# Patient Record
Sex: Male | Born: 1965 | Race: White | Hispanic: No | Marital: Married | Smoking: Never smoker
Health system: Southern US, Community
[De-identification: ages and names within clinical notes are randomized; demographics above are authoritative.]

## PROBLEM LIST (undated history)

## (undated) DIAGNOSIS — I499 Cardiac arrhythmia, unspecified: Secondary | ICD-10-CM

## (undated) DIAGNOSIS — R112 Nausea with vomiting, unspecified: Secondary | ICD-10-CM

## (undated) DIAGNOSIS — R002 Palpitations: Secondary | ICD-10-CM

## (undated) DIAGNOSIS — T4145XA Adverse effect of unspecified anesthetic, initial encounter: Secondary | ICD-10-CM

## (undated) DIAGNOSIS — R599 Enlarged lymph nodes, unspecified: Secondary | ICD-10-CM

## (undated) DIAGNOSIS — Z9889 Other specified postprocedural states: Secondary | ICD-10-CM

## (undated) DIAGNOSIS — T8859XA Other complications of anesthesia, initial encounter: Secondary | ICD-10-CM

## (undated) HISTORY — PX: WISDOM TOOTH EXTRACTION: SHX21

## (undated) HISTORY — PX: LYMPH GLAND EXCISION: SHX13

## (undated) HISTORY — DX: Palpitations: R00.2

## (undated) HISTORY — DX: Enlarged lymph nodes, unspecified: R59.9

## (undated) HISTORY — PX: KNEE ARTHROSCOPY W/ MENISCAL REPAIR: SHX1877

---
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
## 2004-11-24 ENCOUNTER — Ambulatory Visit: Payer: Self-pay | Admitting: Family Medicine

## 2006-01-07 ENCOUNTER — Ambulatory Visit: Payer: Self-pay | Admitting: Internal Medicine

## 2006-02-06 ENCOUNTER — Ambulatory Visit: Payer: Self-pay | Admitting: Internal Medicine

## 2007-08-04 ENCOUNTER — Ambulatory Visit: Payer: Self-pay | Admitting: Cardiology

## 2007-08-05 ENCOUNTER — Observation Stay (HOSPITAL_COMMUNITY): Admission: EM | Admit: 2007-08-05 | Discharge: 2007-08-05 | Payer: Self-pay | Admitting: Emergency Medicine

## 2007-08-05 ENCOUNTER — Ambulatory Visit: Payer: Self-pay | Admitting: Internal Medicine

## 2007-08-05 ENCOUNTER — Encounter (INDEPENDENT_AMBULATORY_CARE_PROVIDER_SITE_OTHER): Payer: Self-pay | Admitting: Internal Medicine

## 2007-08-11 ENCOUNTER — Ambulatory Visit: Payer: Self-pay | Admitting: Internal Medicine

## 2007-10-17 ENCOUNTER — Ambulatory Visit: Payer: Self-pay | Admitting: Internal Medicine

## 2007-12-19 ENCOUNTER — Ambulatory Visit: Payer: Self-pay | Admitting: Internal Medicine

## 2008-04-22 ENCOUNTER — Telehealth (INDEPENDENT_AMBULATORY_CARE_PROVIDER_SITE_OTHER): Payer: Self-pay | Admitting: *Deleted

## 2008-04-26 ENCOUNTER — Ambulatory Visit: Payer: Self-pay | Admitting: Internal Medicine

## 2008-04-26 DIAGNOSIS — R21 Rash and other nonspecific skin eruption: Secondary | ICD-10-CM | POA: Insufficient documentation

## 2010-01-21 ENCOUNTER — Ambulatory Visit: Payer: Self-pay | Admitting: Internal Medicine

## 2010-01-21 DIAGNOSIS — J111 Influenza due to unidentified influenza virus with other respiratory manifestations: Secondary | ICD-10-CM | POA: Insufficient documentation

## 2010-01-21 DIAGNOSIS — J019 Acute sinusitis, unspecified: Secondary | ICD-10-CM

## 2010-12-26 NOTE — Assessment & Plan Note (Signed)
Summary: fever,cong,?sinus infection/nta   Vital Signs:  Patient profile:   45 year old male Height:      77 inches Weight:      225 pounds BMI:     26.78 O2 Sat:      97 % on Room air Temp:     97.9 degrees F oral Pulse rate:   109 / minute BP sitting:   128 / 72  (right arm) Cuff size:   large  Vitals Entered By: Lamar Sprinkles, CMA (January 21, 2010 9:18 AM)  O2 Flow:  Room air CC: Pt c/o sinus pressure/pain, fever and productive cough w/yellow-green mucus. Symptoms started wednesday.    History of Present Illness: George [facility].   onset 3 days ago and now having front cheek pain area  and sore around eyes.   Better today than yesteday  . took  Dayquil and nyquil and advil.    fever  2 .5   days. Didnt get flu shot .  some cough but no cpsob. has myalgias .Marland Kitchen no Vor D.   family member with flu like illness.  No hx of recurrent sinusitis asthma  chronic disease  Preventive Screening-Counseling & Management  Alcohol-Tobacco     Smoking Status: never  Current Medications (verified): 1)  Bactroban 2 %  Crea (Mupirocin Calcium) .... Apply Two Times A Day If Cellulitis Present  Allergies (verified): No Known Drug Allergies  Past History:  Past Medical History: unremarkable  Social History: hh of 7    Non smoker Smoking Status:  never  Review of Systems       The patient complains of anorexia and fever.  The patient denies weight loss, weight gain, vision loss, decreased hearing, hoarseness, chest pain, syncope, dyspnea on exertion, peripheral edema, prolonged cough, hemoptysis, abdominal pain, abnormal bleeding, enlarged lymph nodes, and angioedema.    Physical Exam  General:  congested in nad non toxic apperaing  Head:  Normocephalic and atraumatic without obvious abnormalities. No apparent alopecia or balding. Eyes:  vision grossly intact, pupils equal, and pupils round.   Ears:  R ear normal, L ear normal, and no external  deformities.   Nose:  no external deformity and no external erythema.  congested   face non tender  Mouth:  pharynx pink and moist.   Neck:  No deformities, masses, or tenderness noted. Lungs:  Normal respiratory effort, chest expands symmetrically. Lungs are clear to auscultation, no crackles or wheezes.no dullness.   Heart:  Normal rate and regular rhythm. S1 and S2 normal without gallop, murmur, click, rub or other extra sounds.no lifts.   Pulses:  nl cap refill  Extremities:  no clubbing cyanosis or edema  Skin:  turgor normal, color normal, no ecchymoses, and no petechiae.   Cervical Nodes:  No lymphadenopathy noted Psych:  Oriented X3, good eye contact, not anxious appearing, and not depressed appearing.     Impression & Recommendations:  Problem # 1:  INFLUENZA LIKE ILLNESS (ICD-487.1) poss complicated by sinusitis  .  Expectant management for alarm features    Problem # 2:  SINUSITIS - ACUTE-NOS (ICD-461.9) improvement today      disc aboutuse of med if needed His updated medication list for this problem includes:    Amoxicillin 500 Mg Caps (Amoxicillin) .Marland Kitchen... 2 by mouth three times a day for sinusitis  Complete Medication List: 1)  Bactroban 2 % Crea (Mupirocin calcium) .... Apply two times a day if cellulitis  present 2)  Amoxicillin 500 Mg Caps (Amoxicillin) .... 2 by mouth three times a day for sinusitis  Patient Instructions: 1)  I think you have the flu  2)  fever should be gone  by the end of the weekend if persistent and progressive  call to be rechecked . 3)  If sinus pain returning then can add antibiotic for this . 4)  Acute sinusitis symptoms for less than 10 days are not helped by antibiotics. Use warm moist compresses, and over the counter decongestants( only as directed). Call if no improvement in 5-7 days, sooner if increasing pain, fever, or new symptoms.  Prescriptions: AMOXICILLIN 500 MG CAPS (AMOXICILLIN) 2 by mouth three times a day for sinusitis  #60 x  0   Entered and Authorized by:   Madelin Headings MD   Signed by:   Madelin Headings MD on 01/21/2010   Method used:   Print then Give to Patient   RxID:   [id number]

## 2011-04-10 NOTE — Discharge Summary (Signed)
George Lawrence, George Lawrence                  ACCOUNT NO.:  [id number]   MEDICAL RECORD NO.:  [id number]          PATIENT TYPE:  INP   LOCATION:  6523                         FACILITY:  [facility]   PHYSICIAN:  Valerie A. Felicity Coyer, MDDATE OF BIRTH:  30-Jan-1966   DATE OF ADMISSION:  08/04/2007  DATE OF DISCHARGE:  08/05/2007                               DISCHARGE SUMMARY   DISCHARGE DIAGNOSIS:  1. Symptomatic rapid paroxysmal A fib, spontaneous conversion to      normal sinus, see details below.  2. Seasonal allergies.   DISCHARGE MEDICATIONS:  Include diltiazem CD 120 mg once daily, aspirin  81 mg once daily,  Zyrtec 10 mg daily p.r.n. allergy symptoms.   HOSPITAL FOLLOW-UP:  1. Scheduled with [place] cardiology, Dr. Dietrich Pates for this Friday      September 12 at 9:45 a.m. to review results of echocardiogram done      here at [facility] as well as follow-up on pending TSH and      her need for other evaluation and treatment  2. With primary care physician Dr. Marga Melnick to call for      appointment in 2 weeks or as needed.   CONDITION ON DISCHARGE:  Medically stable and normal sinus rhythm,  asymptomatic.  No chest pain, no shortness of breath, no nausea,  vomiting, anxious for discharge home.   HOSPITAL COURSE BY PROBLEM.:  1. Paroxysmal rapid A fib.  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  He reports that earlier in that evening he      had been delivering a package when he felt palpitations associated      with lightheadedness.  He happened to be delivering to a      physician's house and due to these symptoms this  physician checked      his pulse, found it to be rapid and irregular and referred him for      evaluation in the emergency room. There he was found to be in rapid      A fib by report and begun on  diltiazem drip. As this was begun, he      spontaneously converted back to a normal sinus rhythm with      immediate relief of his symptoms. Point of care cardiac enzymes in      the emergency room were negative x2 but due to these symptoms he      was referred for observation and further evaluation.  The patient      during this time was very anxious for discharge home as he felt      well and had other outside obligations and TSH was checked and      still pending at time of this dictation.  CBC, basic metabolic were      unremarkable.  UDS was negative for drugs of abuse.  He had  no      caffeine, no use of cold or allergy medications or sinus      medications in the last 48 hours, no history of previous clots or      previous breathing problems.  No history of anxiety.  No history of      heart disease.  He was placed empirically on diltiazem CD 120 mg      daily which he tolerated hemodynamically well.  There was no      recurrence of A fib on the monitor and a 2-D echo was checked with      formal results pending at the time of this dictation.  Of note, I      have discussed the story with Dr. Jerral Bonito who is reading      echocardiograms today. He says the LV function is overall normal      but does question focal apical wall motion abnormality versus band      effect and recommend follow-up formally with a cardiologist, which      he feels as appropriate as an outpatient basis given the patient's      lack of symptoms and negative evaluation by report as such.  As      made I have made this follow-up for this week with Dr. Dietrich Pates      as described and the patient is aware of need for follow-up.  I      have chosen not to anticoagulate him at this time due to the      symptomatic short duration with spontaneous conversion, no  LV      function abnormality other than as noted with further discussion      regarding need for other      cardiac ischemic workup testing or  anticoagulation will be deferred      to Dr. Tenny Craw and [facility] cardiology team.  This has been reviewed      with the patient who understands and is aware of the need for      follow-up.  He is otherwise stable for discharge home this      afternoon.      Valerie A. Felicity Coyer, MD  Electronically Signed     VAL/MEDQ  D:  08/05/2007  T:  08/05/2007  Job:  [id number]

## 2011-04-10 NOTE — H&P (Signed)
George Lawrence, GREENLEY NO.:  [id number]   MEDICAL RECORD NO.:  [id number]          PATIENT TYPE:  EMS   LOCATION:  MAJO                         FACILITY:  [facility]   PHYSICIAN:  Hollice Espy, M.D.DATE OF BIRTH:  Apr 08, 1966   DATE OF ADMISSION:  08/04/2007  DATE OF DISCHARGE:                              HISTORY & PHYSICAL   PRIMARY CARE PHYSICIAN:  Titus Dubin. Alwyn Ren, MD,FACP,FCCP.   CHIEF COMPLAINT:  Palpitations.   HISTORY OF PRESENT ILLNESS:  The patient is a 45 year old white male  with past medical history of cat scratch fever when he was a child who  presents to the emergency room today with sudden onset of complaints of  palpitations and lightheadedness.  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  He was delivering a package to a physician who  examined him and found his pulse to be irregular and advised the patient  to go over to the emergency room.  The patient went to the emergency  room.  He had an EKG done and was found to be in atrial fibrillation.  He was started on Cardizem drip which converted him to normal sinus  rhythm.  Apparently he was on the drip at 5 mg an hour with heart rate  of 70, normal sinus rhythm.  He feels okay.   He denies any headaches, vision changes, dysphasia, chest pain,  palpitations, shortness of breath, wheeze, cough, abdominal pain,  hematuria, dysuria, constipation, diarrhea, focal extremity numbness,  weakness or pain.   Lab work was done including EKG, cardiac enzymes, all of which were  unremarkable.   PAST MEDICAL HISTORY:  Cat scratch fever, status post lymph node removal  as a child.   MEDICATIONS:  None.   ALLERGIES:  None.   SOCIAL HISTORY:  Denies any tobacco, alcohol or drug use.  No excessive  caffeine intake.  No energy drink or herbal stimulant intake.  He does  take an occasional multivitamins.   FAMILY  HISTORY:  Noncontributory.   PHYSICAL EXAMINATION:  VITAL SIGNS:  On admission, heart rate initially  151, now down to 80 and normal sinus rhythm.  Temperature 98.8, blood  pressure 141/84, respiratory rate 18.  His blood pressure since his  heart rate has come down is 108/74.  Oxygen saturation 98% on room air.  GENERAL:  The patient is alert and oriented x3, in no apparent distress.  HEENT:  Normocephalic, atraumatic.  Mucous membranes are moist.  He has  no carotid bruits.  HEART:  Regular rate and rhythm.  S1 and S2.  LUNGS:  Clear to auscultation bilaterally.  ABDOMEN:  Soft, nontender, nondistended.  Positive bowel sounds.  EXTREMITIES:  No clubbing, cyanosis or edema.   LABORATORY DATA:  White count 6.5, hemoglobin 15.6, hematocrit 45, MCV  91, platelet count 205,000.  Chest x-ray unremarkable.  LFTs  unremarkable.  CPK 53.7, MB less than 1, troponin-I less than 0.05.  Sodium 140,  potassium 4, chloride 107, bicarb 28,  BUN 15, creatinine 1, glucose 92.  EKG initially showed atrial fibrillation.  Second showed normal sinus  rhythm.  Urine drug screen negative.   ASSESSMENT/PLAN:  1. Brief onset of atrial fibrillation.  Unusual in a patient with no      previous medical conditions and not on any type of stimulants.      Will plan to watch the patient overnight on telemetry.  Check a 2-D      echocardiogram.  Have ordered a TSH level.  Will wean him off his      Cardizem drip and start him on p.o. Cardizem.      Hollice Espy, M.D.  Electronically Signed     SKK/MEDQ  D:  08/05/2007  T:  08/05/2007  Job:  [id number]   cc:   Titus Dubin. Alwyn Ren, MD,FACP,FCCP

## 2011-04-10 NOTE — Assessment & Plan Note (Signed)
George Lawrence                            CARDIOLOGY OFFICE NOTE   George Lawrence, George Lawrence                         MRN:          [id number]  DATE:12/19/2007                            DOB:          [birth year]    IDENTIFICATION:  The patient is a 45 year old gentleman with a history  of lone atrial fibrillation.  I last saw him back in November.   He comes in today as he said on Sunday he was watching TV, he had  finished splitting wood a couple hours prior, he was watching football,  and he began noticing his heart skip.  It would come on every 5 minutes  and he would notice about 8 or 10 skipped beats and then resolve.  He  took his pulse at his carotid and he said he felt not a rapid heartbeat  but more like a skin happened, and after a pause he feels a surge.   He denies caffeine.  He denies excessive alcohol.  Had not had any other  illness or symptoms of illness at that time.   CURRENT MEDICATIONS:  1. Diltiazem 120.  2. Aspirin 81.  3. Multivitamin.  4. Vitamin C.   PHYSICAL EXAMINATION:  GENERAL:  The patient is in no distress.  VITAL SIGNS:  Blood pressure is 133/85, pulse is 85 and regular, weight  221.  LUNGS:  Clear to auscultation.  CARDIAC:  Regular rate and rhythm, S1-S2.  No S3, no murmurs.  ABDOMEN:  Benign.  EXTREMITIES:  No edema.   STUDIES:  A 12-lead EKG normal sinus rhythm, sinus arrhythmia.   IMPRESSION:  Skips, does not actually sound like atrial fibrillation,  sounds like it may be isolated PVCs, and he is feeling the post PVCs  surge.  If this is the only spell, I would not work up further.  If he  has a recurrence, I will check electrolytes and thyroid.  1. Atrial fibrillation, again, continue on current regimen.  I have      encouraged him to stay active, stay adequately hydrated, watch      excess alcohol and caffeine.  He is drinking no caffeinated tea.  A      follow-up as previously scheduled in August.     Pricilla Riffle, MD, [facility]  Electronically Signed   PVR/MedQ  DD: 12/19/2007  DT: 12/19/2007  Job #: [id number]   cc:   Titus Dubin. Alwyn Ren, MD,FACP,FCCP

## 2011-04-13 NOTE — Assessment & Plan Note (Signed)
[facility]                            CARDIOLOGY OFFICE NOTE   George Lawrence, George Lawrence                         MRN:          [id number]  DATE:10/17/2007                            DOB:          [birth year]    Date of visit:  October 17, 2007.   IDENTIFICATION:  George Lawrence is a 45 year old gentleman who I was set to  see in September.  He could not wait though for the appointment.  He had  to reschedule for today.   The patient has a history of episode of atrial fibrillation.  He was  admitted in September.  He was discharged home on Diltiazem CD.   In the interval he has done well.  He denies palpitations.  He is  active.  No dizziness.   CURRENT MEDICATIONS:  1. Diltiazem 120 daily.  2. Aspirin 81.  3. Vitamin daily.  4. Vitamin C daily.   PHYSICAL EXAMINATION:  The patient is in no distress.  His blood pressure is 118/80, pulse is 88 and regular, weight 221.  LUNGS:  Clear.  NECK:  JVP is normal.  CARDIAC:  Regular rate and rhythm.  S1/S2, no S3, no murmurs.  ABDOMEN:  Benign.  EXTREMITIES:  No edema.   IMPRESSION:  1. Atrial fibrillation, lone atrial fibrillation, echo normal.  I am      reluctant to take him off the Diltiazem.  He has done well.  I will      set to see him back in August.  If he has any recurrence of course,      contact sooner.  2. Health care maintenance.  Will make sure he has a fasting lipid      panel at some point.     Pricilla Riffle, MD, [facility]  Electronically Signed    PVR/MedQ  DD: 10/19/2007  DT: 10/20/2007  Job #: [id number]   cc:   Titus Dubin. Alwyn Ren, MD,FACP,FCCP

## 2011-09-07 LAB — I-STAT 8, (EC8 V) (CONVERTED LAB)
BUN: 15
Bicarbonate: 27.6 — ABNORMAL HIGH
Glucose, Bld: 92
Hemoglobin: 16.3
Sodium: 140
TCO2: 29
pCO2, Ven: 51.3 — ABNORMAL HIGH
pH, Ven: 7.339 — ABNORMAL HIGH

## 2011-09-07 LAB — POCT I-STAT CREATININE: Creatinine, Ser: 1

## 2011-09-07 LAB — POCT CARDIAC MARKERS
CKMB, poc: <1 — ABNORMAL LOW
CKMB, poc: <1 — ABNORMAL LOW
Myoglobin, poc: 46.7
Myoglobin, poc: 53.7
Operator id: 151321
Operator id: 151321
Troponin i, poc: <0.05

## 2011-09-07 LAB — HEPATIC FUNCTION PANEL
AST: 26
Bilirubin, Direct: 0.1
Total Bilirubin: 0.9

## 2011-09-07 LAB — RAPID URINE DRUG SCREEN, HOSP PERFORMED
Barbiturates: NOT DETECTED
Benzodiazepines: NOT DETECTED

## 2011-09-07 LAB — CBC
Hemoglobin: 15.6
MCHC: 34.3
MCV: 91.3
RDW: 12.4

## 2011-09-07 LAB — DIFFERENTIAL
Basophils Absolute: 0
Basophils Relative: 0
Eosinophils Absolute: 0.2
Monocytes Absolute: 0.6
Monocytes Relative: 9
Neutro Abs: 4
Neutrophils Relative %: 61

## 2011-09-07 LAB — TSH: TSH: 1.682

## 2011-12-05 ENCOUNTER — Ambulatory Visit (INDEPENDENT_AMBULATORY_CARE_PROVIDER_SITE_OTHER): Payer: PRIVATE HEALTH INSURANCE | Admitting: Internal Medicine

## 2011-12-05 ENCOUNTER — Encounter: Payer: Self-pay | Admitting: Internal Medicine

## 2011-12-05 VITALS — BP 124/90 | HR 92 | Temp 98.1°F | Wt 221.2 lb

## 2011-12-05 DIAGNOSIS — J069 Acute upper respiratory infection, unspecified: Secondary | ICD-10-CM

## 2011-12-05 MED ORDER — AMOXICILLIN 500 MG PO CAPS: 500.0000 mg | ORAL_CAPSULE | Freq: Three times a day (TID) | ORAL | Status: AC

## 2011-12-05 MED ORDER — FLUTICASONE PROPIONATE 50 MCG/ACT NA SUSP: 1.0000 | Freq: Two times a day (BID) | NASAL | Status: DC | PRN

## 2011-12-05 NOTE — Progress Notes (Signed)
  Subjective:    Patient ID: George Lawrence, male    DOB: [birth year], 46 y.o.   MRN: [id number]  HPI Respiratory tract infection Onset/symptoms:1/4 as head congestion Exposures (illness/environmental/extrinsic):on his route Progression of symptoms:into chest  Treatments/response:Decongestants& Nyquil with temp benefit Present symptoms: Fever/chills/sweats:temp to ?; sweats last night Frontal headache:yes Facial pain:yes Nasal purulence:scant yellow Sore throat:no Dental pain:no Lymphadenopathy:no Wheezing/shortness of breath:no Cough/sputum/hemoptysis:NP cough Pleuritic pain:no Associated extrinsic/allergic symptoms:itchy eyes/ sneezing:no Past medical history: Seasonal allergies: yes/asthma:no Smoking history:never  He did take a flu shot this year.          Review of Systems     Objective:   Physical Exam General appearance:good health ;well nourished; no acute distress or increased work of breathing is present.  No  lymphadenopathy about the head, neck, or axilla noted.   Eyes: No conjunctival inflammation or lid edema is present.   Ears:  External ear exam shows no significant lesions or deformities.  Otoscopic examination reveals clear canals, tympanic membranes are intact bilaterally without bulging, retraction, inflammation or discharge.  Nose:  External nasal examination shows no deformity or inflammation. Nasal mucosa are pink and moist without lesions or exudates. Insignificant septal dislocation .No obstruction to airflow.   Oral exam: Dental hygiene is good; lips and gums are healthy appearing.There is no oropharyngeal erythema or exudate noted.      Heart:  Normal rate and regular rhythm. S1 and S2 normal without gallop,  click, rub . Faint flow murmur at the left sternal border suggested with inspiration only. Lungs:Chest clear to auscultation; no wheezes, rhonchi,rales ,or rubs present.No increased work of breathing.    Extremities:  No cyanosis, edema,  or clubbing  noted    Skin: Warm & dry           Assessment & Plan:  #1 upper respiratory tract infection ; rhinosinusitis suggested.  #2 nonproductive cough.  Plan see orders and recommendations

## 2011-12-05 NOTE — Patient Instructions (Signed)
Plain Mucinex for thick secretions ;force NON dairy fluids . Use a Neti pot daily as needed for sinus congestion . Fluticasone 1 spray in each nostril twice a day as needed. Use the "crossover" technique as discussed   

## 2015-09-27 ENCOUNTER — Ambulatory Visit: Payer: PRIVATE HEALTH INSURANCE | Admitting: Internal Medicine

## 2015-09-27 DIAGNOSIS — R4689 Other symptoms and signs involving appearance and behavior: Secondary | ICD-10-CM | POA: Insufficient documentation

## 2015-09-27 DIAGNOSIS — Z0289 Encounter for other administrative examinations: Secondary | ICD-10-CM

## 2015-10-12 ENCOUNTER — Other Ambulatory Visit: Payer: Self-pay

## 2015-10-12 DIAGNOSIS — R599 Enlarged lymph nodes, unspecified: Secondary | ICD-10-CM | POA: Insufficient documentation

## 2015-10-12 DIAGNOSIS — R002 Palpitations: Secondary | ICD-10-CM | POA: Insufficient documentation

## 2015-10-13 ENCOUNTER — Ambulatory Visit (INDEPENDENT_AMBULATORY_CARE_PROVIDER_SITE_OTHER): Payer: BLUE CROSS/BLUE SHIELD | Admitting: Internal Medicine

## 2015-10-13 ENCOUNTER — Encounter: Payer: Self-pay | Admitting: Internal Medicine

## 2015-10-13 VITALS — BP 129/80 | HR 94 | Ht 77.0 in | Wt 225.0 lb

## 2015-10-13 DIAGNOSIS — R002 Palpitations: Secondary | ICD-10-CM | POA: Diagnosis not present

## 2015-10-13 LAB — TSH: TSH: 0.994 u[IU]/mL (ref 0.350–4.500)

## 2015-10-13 NOTE — Progress Notes (Signed)
Cardiology Office Note   Date:  10/13/2015   ID:  George Lawrence, DOB 24-Oct-1966, MRN [id number]  PCP:  Marga Melnick, MD  Cardiologist:   Dietrich Pates, MD    F/U of Afib     History of Present Illness: George Lawrence is a 49 y.o. male with a history of atrial fib  Only had one spell in 2008  Hosp at that time  EKG in epic.  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  Sat in truck [place] go away.  No dizziness  Drove to next stop and called 911  When EMS arrive HR was coming down Does drink caffeinated drinks.     No palpitations in between   Active  No SOB  No CP  Nor dizziness Very active     Current Outpatient Prescriptions  Medication Sig Dispense Refill  . Diphenhydramine-Acetaminophen (TYLENOL SEVERE ALLERGY PO) Take 2 tablets by mouth as needed (ALLERGIES).    . DM-Doxylamine-Acetaminophen (NYQUIL COLD & FLU PO) Take 30 mLs by mouth Nightly.     No current facility-administered medications for this visit.    Allergies:   Oxycodone   Past Medical History  Diagnosis Date  . Heart palpitations   . Swollen lymph nodes     AGE 56 HAD CAT SCARTCH FEVER    Past Surgical History  Procedure Laterality Date  . Lymph gland excision    . Wisdom tooth extraction    . Knee arthroscopy w/ meniscal repair Bilateral     MENISCUS TEAR     Social History:  The patient  reports that he has never smoked. He does not have any smokeless tobacco history on file. He reports that he drinks alcohol. He reports that he does not use illicit drugs.   Family History:  The patient's family history includes Cancer (age of onset: 38) in his mother; Heart attack in his maternal uncle; Heart block in his father. There is no history of Hypertension.    ROS:  Please see the history of present illness. All other systems are reviewed and  Negative to the above problem except as  noted.    PHYSICAL EXAM: VS:  BP 129/80 mmHg  Pulse 94  Ht  (1.956 m)  Wt 102.059 kg (225 lb)  BMI 26.68 kg/m2  GEN: Well nourished, well developed, in no acute distress HEENT: normal Neck: no JVD, carotid bruits, or masses Cardiac: RRR; no murmurs, rubs, or gallops,no edema  Respiratory:  clear to auscultation bilaterally, normal work of breathing GI: soft, nontender, nondistended, + BS  No hepatomegaly  MS: no deformity Moving all extremities   Skin: warm and dry, no rash Neuro:  Strength and sensation are intact Psych: euthymic mood, full affect   EKG:  EKG is ordered today.  SR 94 bpm     Lipid Panel No results found for: CHOL, TRIG, HDL, CHOLHDL, VLDL, LDLCALC, LDLDIRECT    Wt Readings from Last 3 Encounters:  10/13/15 102.059 kg (225 lb)  12/05/11 100.336 kg (221 lb 3.2 oz)  01/21/10 102.059 kg (225 lb)      ASSESSMENT AND PLAN:  1  PAF  Spell a couple weeks ago suspicious for afib  Converted   Will follow for recurrences  If ncrease would set up for event monitor   Watch caffeine and etoh intake  F/u as needed       Signed, Dietrich PatesPaula Tashawna Thom, MD  10/13/2015 12:52 PM    [facility] [address], [place], [place]  [address] Phone: [phone]) [phone]; Fax: [phone]) [phone]

## 2015-10-13 NOTE — Patient Instructions (Signed)
Your physician recommends that you return for lab work in: today [facility](TSH)  Your physician recommends that you schedule a follow-up appointment as needed with Dr. Tenny Crawoss.

## 2019-01-30 ENCOUNTER — Ambulatory Visit (INDEPENDENT_AMBULATORY_CARE_PROVIDER_SITE_OTHER): Payer: Self-pay

## 2019-01-30 ENCOUNTER — Other Ambulatory Visit: Payer: Self-pay | Admitting: Gerontology

## 2019-01-30 DIAGNOSIS — M546 Pain in thoracic spine: Secondary | ICD-10-CM

## 2019-01-30 DIAGNOSIS — R52 Pain, unspecified: Secondary | ICD-10-CM

## 2019-01-30 DIAGNOSIS — M50323 Other cervical disc degeneration at C6-C7 level: Secondary | ICD-10-CM

## 2019-05-22 ENCOUNTER — Ambulatory Visit: Payer: Self-pay | Admitting: Orthopedic Surgery

## 2019-05-26 ENCOUNTER — Ambulatory Visit: Payer: Self-pay | Admitting: Orthopedic Surgery

## 2019-05-26 NOTE — H&P (Signed)
Subjective:   This patient is a pleasant 53 year old male with no significant past medical history who is scheduled for an ACDF of C6-7 at [facility] current hospital with Dr. 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 This has been ongoing since February 20, 2019 and despite conservative treatment measures to include medrol dose pack, physical therapy, and injections the patient continues to have symptoms that are interfering with his quality of life; he therefore would like to move forward with surgical intervention.  We have obtained pre-operative clearance (no formal recommendations from PCP). He is scheduled for his pre-op exam and testing at [facility]. He is also scheduled to be fitted for his aspen collar.  Patient Active Problem List   Diagnosis Date Noted  . Heart palpitations 10/12/2015  . Swollen lymph nodes   . Non-compliant behavior 09/27/2015   Past Medical History:  Diagnosis Date  . Heart palpitations   . Swollen lymph nodes    AGE 44 HAD CAT SCARTCH FEVER    Past Surgical History:  Procedure Laterality Date  . KNEE ARTHROSCOPY W/ MENISCAL REPAIR Bilateral    MENISCUS TEAR  . LYMPH GLAND EXCISION    . WISDOM TOOTH EXTRACTION      Current Outpatient Medications  Medication Sig Dispense Refill Last Dose  . acetaminophen (TYLENOL) 500 MG tablet Take 1,000 mg by mouth every 6 (six) hours as needed for moderate pain or headache.     . fexofenadine (ALLEGRA) 180 MG tablet Take 180 mg by mouth daily as needed for allergies or rhinitis.     Marland Kitchen gabapentin (NEURONTIN) 300 MG capsule Take 300 mg by mouth at bedtime.     Marland Kitchen ibuprofen (ADVIL) 200 MG tablet Take 800 mg by mouth every 6 (six) hours as needed for headache or moderate pain.     . Multiple Vitamins-Minerals (ONE-A-DAY MENS HEALTH FORMULA PO) Take 1 tablet by mouth daily.      No current facility-administered medications for this  visit.    Allergies  Allergen Reactions  . Oxycodone Hives    Social History   Tobacco Use  . Smoking status: Never Smoker  Substance Use Topics  . Alcohol use: Yes    Family History  Problem Relation Age of Onset  . Cancer Mother 70       PANCREATIC CANCER  . Heart block Father        STENTS X2  . Heart attack Maternal Uncle   . Hypertension Neg Hx     Review of Systems As stated in HPI  Objective:   General: AAOX3, well developed and well nourished, NAD  Ambulation: normal gait pattern, uses no assistive device.  Inspection: No obvious deformity, scoleosis, kyphosis, loss of lordotic curve.   Lungs: CTAB  Abdome: Normal BSX4, non-tender, non-distended, no hepatosplenomegaly.  Palpation: Tender over spinous processes and neck musculature.  AROM: -Shoulder, elbow, and wrists AROM normal and pain free.  Dermatomes: UE dermatomes abnormal to light touch left C7 dermatome pattern  Myotomes: - shoulder shrug: Left 5/5, Right 5/5 -Shoulder Abduction: Left 5/5, Right 5/5 - Elbow flexion: Left 5/5, Right 5/5 - Elbow extension Left 4+/5, Right 5/5 - Wrist extesion Left 4+/5, Right 4+/5 - Finger abduction: Left 5/5, Right 5/5 - finger Adduction/squeeze: Left 5/5, Right 5/5  Reflexes: - Biceps: Left1+, Right 1+ - Brachioradialius: Left1+, Right 1+ - Triceps: Left 1+, Right 1+ - Hoffman's: Negative - Babinski: Left Ngative, Right  Negative - Clonus: Negative    Special Tests: - UE Neural tension test: Left Positive, Right Negative  PV: Extremities warm and well profused. Distal pulses 2+ bilaterally.  Patient reports no significant improvement overall with his cervical epidural steroid injection.  X-Ray impression: cervical spine x-rays from 02/20/2019 show Severe DDD at C6/7 level. No significant listhesis, no significant curveture, no signs of fracture.  MRI Impression: Cervical MRI: completed on 04/09/19 was reviewed with the patient. I have also reviewed  the radiology report. 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 No cord signal changes. Mild to moderate degenerative changes at the remaining levels of the cervical spine.  Assessment:   Francis DowseJoel is a very pleasant 53 year old gentleman who has been having ongoing significant neck and neuropathic arm pain left side worse than the right. MRI confirms foraminal stenosis with C7 nerve root irritation. At this point despite the injection therapy, as well as physical therapy he continues to have neuropathic left arm pain and significant neck pain. At this point I think it is reasonable to move forward with surgery.  Plan:   [facility] C6-[facility] Shon BatonBrooks.  We have discussed the disc replacement as well as fusion and he would like to move forward with a straightforward single level ACDF. We have gone over the surgery Risks and benefits of surgery were discussed with the patient. These include: Infection, bleeding, death, stroke, paralysis, ongoing or worse pain, need for additional surgery, nonunion, leak of spinal fluid, adjacent segment degeneration requiring additional fusion surgery. Pseudoarthrosis (nonunion)requiring supplemental posterior fixation. Throat pain, swallowing difficulties, hoarseness or change in voice.  We have also discussed the goals of surgery to include: Goals of surgery: Reduction in pain, and improvement in quality of life.  We have also discussed the post-operative recovery period to include: bathing/showering restrictions, wound healing, activity (and driving) restrictions, medications/pain mangement. Of note, patient has a reported allergy to Hydrocodone.  We have also discussed post-operative redflags to include: signs and symptoms of postoperative infection, DVT/PE.  Follow-up: 2 weeks after surgery.

## 2019-05-28 ENCOUNTER — Encounter (HOSPITAL_COMMUNITY): Payer: Self-pay

## 2019-05-28 NOTE — Pre-Procedure Instructions (Signed)
[id number] - [place], [place] - [address] [place]  [address] Phone: [phone] Fax: [phone]      Your procedure is scheduled on 06-04-19 Thursday  Report to [facility] Main Entrance "A" at  0530 A.M., and check in at the Admitting office.  Call this number if you have problems the morning of surgery:  [phone]  Call [phone] if you have any questions prior to your surgery date Monday-Friday 8am-4pm    Remember:  Do not eat or drink after midnight the night before your surgery  Take these medicines the morning of surgery with A SIP OF WATER : acetaminophen (TYLENOL)as needed fexofenadine (ALLEGRA)as needed  7 days prior to surgery STOP taking any Aspirin (unless otherwise instructed by your surgeon), Aleve, Naproxen, Ibuprofen, Motrin, Advil, Goody's, BC's, all herbal medications, fish oil, and all vitamins.    The Morning of Surgery  Do not wear jewelry, make-up or nail polish.  Do not wear lotions, powders, or perfumes/colognes, or deodorant  Do not shave 48 hours prior to surgery.  Men may shave face and neck.  Do not bring valuables to the hospital.  [facility] is not responsible for any belongings or valuables.  If you are a smoker, DO NOT Smoke 24 hours prior to surgery IF you wear a CPAP at night please bring your mask, tubing, and machine the morning of surgery   Remember that you must have someone to transport you home after your surgery, and remain with you for 24 hours if you are discharged the same day.   Contacts, glasses, hearing aids, dentures or bridgework may not be worn into surgery.    Leave your suitcase in the car.  After surgery it may be brought to your room.  For patients admitted to the hospital, discharge time will be determined by your treatment team.  Patients discharged the day of surgery will not be allowed to drive home.    Special instructions:    [facility]- Preparing For Surgery  Before surgery, you can play an important role. Because skin is not sterile, your skin needs to be as free of germs as possible. You can reduce the number of germs on your skin by washing with CHG (chlorahexidine gluconate) Soap before surgery.  CHG is an antiseptic cleaner which kills germs and bonds with the skin to continue killing germs even after washing.    Oral Hygiene is also important to reduce your risk of infection.  Remember - BRUSH YOUR TEETH THE MORNING OF SURGERY WITH YOUR REGULAR TOOTHPASTE  Please do not use if you have an allergy to CHG or antibacterial soaps. If your skin becomes reddened/irritated stop using the CHG.  Do not shave (including legs and underarms) for at least 48 hours prior to first CHG shower. It is OK to shave your face.  Please follow these instructions carefully.   1. Shower the NIGHT BEFORE SURGERY and the MORNING OF SURGERY with CHG Soap.   2. If you chose to wash your hair, wash your hair first as usual with your normal shampoo.  3. After you shampoo, rinse your hair and body thoroughly to remove the shampoo.  4. Use CHG as you would any other liquid soap. You can apply CHG directly to the skin and wash gently with a scrungie or a clean washcloth.   5. Apply the CHG Soap to your body ONLY FROM THE NECK DOWN.  Do not use on open wounds or open sores. Avoid contact with your eyes, ears, mouth and genitals (private parts). Wash Face and genitals (private parts)  with your normal soap.   6. Wash thoroughly, paying special attention to the area where your surgery will be performed.  7. Thoroughly rinse your body with warm water from the neck down.  8. DO NOT shower/wash with your normal soap after using and rinsing off the CHG Soap.  9. Pat yourself dry with a CLEAN TOWEL.  10. Wear CLEAN PAJAMAS to bed the night before surgery, wear comfortable clothes the morning of surgery  11. Place CLEAN SHEETS on your bed  the night of your first shower and DO NOT SLEEP WITH PETS.    Day of Surgery:  Do not apply any deodorants/lotions. Please shower the morning of surgery with the CHG soap  Please wear clean clothes to the hospital/surgery center.   Remember to brush your teeth WITH YOUR REGULAR TOOTHPASTE.   Please read over the following fact sheets that you were given.

## 2019-06-01 ENCOUNTER — Encounter (HOSPITAL_COMMUNITY)
Admission: RE | Admit: 2019-06-01 | Discharge: 2019-06-01 | Disposition: A | Discharge status: Home / Self Care | Payer: No Typology Code available for payment source | Source: Ambulatory Visit | Attending: Orthopedic Surgery | Admitting: Orthopedic Surgery

## 2019-06-01 ENCOUNTER — Other Ambulatory Visit (HOSPITAL_COMMUNITY)
Admission: RE | Admit: 2019-06-01 | Discharge: 2019-06-01 | Disposition: A | Discharge status: Home / Self Care | Payer: No Typology Code available for payment source | Source: Ambulatory Visit | Attending: Orthopedic Surgery | Admitting: Orthopedic Surgery

## 2019-06-01 ENCOUNTER — Encounter (HOSPITAL_COMMUNITY): Payer: Self-pay

## 2019-06-01 ENCOUNTER — Other Ambulatory Visit: Payer: Self-pay

## 2019-06-01 ENCOUNTER — Ambulatory Visit (HOSPITAL_COMMUNITY)
Admission: RE | Admit: 2019-06-01 | Discharge: 2019-06-01 | Disposition: A | Discharge status: Home / Self Care | Payer: No Typology Code available for payment source | Source: Ambulatory Visit | Attending: Orthopedic Surgery | Admitting: Orthopedic Surgery

## 2019-06-01 DIAGNOSIS — Z1159 Encounter for screening for other viral diseases: Secondary | ICD-10-CM | POA: Insufficient documentation

## 2019-06-01 DIAGNOSIS — M47812 Spondylosis without myelopathy or radiculopathy, cervical region: Secondary | ICD-10-CM | POA: Diagnosis not present

## 2019-06-01 DIAGNOSIS — Z01818 Encounter for other preprocedural examination: Secondary | ICD-10-CM

## 2019-06-01 HISTORY — DX: Nausea with vomiting, unspecified: R11.2

## 2019-06-01 HISTORY — DX: Other specified postprocedural states: Z98.890

## 2019-06-01 HISTORY — DX: Other complications of anesthesia, initial encounter: T88.59XA

## 2019-06-01 HISTORY — DX: Cardiac arrhythmia, unspecified: I49.9

## 2019-06-01 LAB — BASIC METABOLIC PANEL
Anion gap: 10 (ref 5–15)
BUN: 21 mg/dL — ABNORMAL HIGH (ref 6–20)
CO2: 25 mmol/L (ref 22–32)
Calcium: 9.3 mg/dL (ref 8.9–10.3)
Chloride: 105 mmol/L (ref 98–111)
Creatinine, Ser: 1.16 mg/dL (ref 0.61–1.24)
GFR calc Af Amer: >60 mL/min (ref >60)
GFR calc non Af Amer: >60 mL/min (ref >60)
Glucose, Bld: 96 mg/dL (ref 70–99)
Potassium: 3.9 mmol/L (ref 3.5–5.1)
Sodium: 140 mmol/L (ref 135–145)

## 2019-06-01 LAB — CBC
HCT: 50.7 % (ref 39.0–52.0)
Hemoglobin: 16.8 g/dL (ref 13.0–17.0)
MCH: 31.2 pg (ref 26.0–34.0)
MCHC: 33.1 g/dL (ref 30.0–36.0)
MCV: 94.1 fL (ref 80.0–100.0)
Platelets: 195 10*3/uL (ref 150–400)
RBC: 5.39 MIL/uL (ref 4.22–5.81)
RDW: 11.8 % (ref 11.5–15.5)
WBC: 5.5 10*3/uL (ref 4.0–10.5)
nRBC: 0 % (ref 0.0–0.2)

## 2019-06-01 LAB — SURGICAL PCR SCREEN
MRSA, PCR: NEGATIVE
Staphylococcus aureus: POSITIVE — AB

## 2019-06-01 LAB — URINALYSIS, ROUTINE W REFLEX MICROSCOPIC
Bilirubin Urine: NEGATIVE
Glucose, UA: NEGATIVE mg/dL
Hgb urine dipstick: NEGATIVE
Ketones, ur: NEGATIVE mg/dL
Leukocytes,Ua: NEGATIVE
Nitrite: NEGATIVE
Protein, ur: NEGATIVE mg/dL
Specific Gravity, Urine: 1.021 (ref 1.005–1.030)
pH: 6 (ref 5.0–8.0)

## 2019-06-01 LAB — PROTIME-INR
INR: 1 (ref 0.8–1.2)
Prothrombin Time: 13.2 seconds (ref 11.4–15.2)

## 2019-06-01 LAB — APTT: aPTT: 31 seconds (ref 24–36)

## 2019-06-01 NOTE — Progress Notes (Signed)
PCP - Dr. Seward Carol Cardiologist - denies  Chest x-ray - 06/01/2019 EKG - requested, per patient "recently had one done in June" Stress Test - denies ECHO - 2008 Cardiac Cath - denies  Sleep Study - denies CPAP - N/A  Blood Thinner Instructions: N/A Aspirin Instructions: N/A  Anesthesia review: YES, per order and heart hx  Coronavirus Screening  Have you experienced the following symptoms:  Cough yes/no: No Fever (>100.31F)  yes/no: No Runny nose yes/no: No Sore throat yes/no: No Difficulty breathing/shortness of breath  yes/no: No  Have you or a family member traveled in the last 14 days and where? yes/no: No  If the patient indicates "YES" to the above questions, their PAT will be rescheduled to limit the exposure to others and, the surgeon will be notified. THE PATIENT WILL NEED TO BE ASYMPTOMATIC FOR 14 DAYS.   If the patient is not experiencing any of these symptoms, the PAT nurse will instruct them to NOT bring anyone with them to their appointment since they may have these symptoms or traveled as well.   Please remind your patients and families that hospital visitation restrictions are in effect and the importance of the restrictions.   Patient denies shortness of breath, fever, cough and chest pain at PAT appointment   Patient verbalized understanding of instructions that were given to them at the PAT appointment. Patient was also instructed that they will need to review over the PAT instructions again at home before surgery.

## 2019-06-02 ENCOUNTER — Encounter (HOSPITAL_COMMUNITY): Payer: Self-pay

## 2019-06-02 LAB — SARS CORONAVIRUS 2 (TAT 6-24 HRS): SARS Coronavirus 2: NEGATIVE

## 2019-06-02 NOTE — Anesthesia Preprocedure Evaluation (Addendum)
Anesthesia Evaluation  Patient identified by MRN, date of birth, ID band Patient awake    Reviewed: Allergy & Precautions, NPO status , Patient's Chart, lab work & pertinent test results  History of Anesthesia Complications (+) PONV and history of anesthetic complications  Airway Mallampati: II  TM Distance: >3 FB Neck ROM: Full    Dental  (+) Dental Advisory Given, Teeth Intact   Pulmonary neg pulmonary ROS,    Pulmonary exam normal breath sounds clear to auscultation       Cardiovascular Normal cardiovascular exam+ dysrhythmias  Rhythm:Regular Rate:Normal     Neuro/Psych negative neurological ROS     GI/Hepatic negative GI ROS, Neg liver ROS,   Endo/Other  negative endocrine ROS  Renal/GU negative Renal ROS     Musculoskeletal negative musculoskeletal ROS (+)   Abdominal   Peds  Hematology negative hematology ROS (+)   Anesthesia Other Findings Day of surgery medications reviewed with the patient.  Reproductive/Obstetrics                            Anesthesia Physical Anesthesia Plan  ASA: II  Anesthesia Plan: General   Post-op Pain Management:    Induction: Intravenous  PONV Risk Score and Plan: 4 or greater and Ondansetron, Dexamethasone, Midazolam and Treatment may vary due to age or medical condition  Airway Management Planned: Oral ETT  Additional Equipment: None  Intra-op Plan:   Post-operative Plan: Extubation in OR  Informed Consent: I have reviewed the patients History and Physical, chart, labs and discussed the procedure including the risks, benefits and alternatives for the proposed anesthesia with the patient or authorized representative who has indicated his/her understanding and acceptance.     Dental advisory given  Plan Discussed with: CRNA  Anesthesia Plan Comments: (Cleared by PCP 05/14/19. Note on pt chart.  EKG 05/14/19 (copy on pt chart): Sinus  rhythm. Rate 91. Low voltage with rightward P-axis and rotation - possible pulmonary disease)      Anesthesia Quick Evaluation

## 2019-06-04 ENCOUNTER — Ambulatory Visit (HOSPITAL_COMMUNITY)
Admission: RE | Disposition: A | Discharge status: Home / Self Care | Payer: Self-pay | Source: Home / Self Care | Attending: Orthopedic Surgery

## 2019-06-04 ENCOUNTER — Other Ambulatory Visit: Payer: Self-pay

## 2019-06-04 ENCOUNTER — Ambulatory Visit (HOSPITAL_COMMUNITY): Payer: No Typology Code available for payment source | Attending: Orthopedic Surgery

## 2019-06-04 ENCOUNTER — Ambulatory Visit (HOSPITAL_COMMUNITY): Payer: No Typology Code available for payment source | Admitting: Anesthesiology

## 2019-06-04 ENCOUNTER — Ambulatory Visit (HOSPITAL_COMMUNITY): Payer: No Typology Code available for payment source | Admitting: Physician Assistant

## 2019-06-04 ENCOUNTER — Ambulatory Visit (HOSPITAL_COMMUNITY)
Admission: RE | Admit: 2019-06-04 | Discharge: 2019-06-04 | Disposition: A | Discharge status: Home / Self Care | Payer: No Typology Code available for payment source | Attending: Orthopedic Surgery | Admitting: Orthopedic Surgery

## 2019-06-04 ENCOUNTER — Encounter (HOSPITAL_COMMUNITY): Payer: Self-pay

## 2019-06-04 DIAGNOSIS — Z79899 Other long term (current) drug therapy: Secondary | ICD-10-CM | POA: Insufficient documentation

## 2019-06-04 DIAGNOSIS — M2578 Osteophyte, vertebrae: Secondary | ICD-10-CM | POA: Diagnosis not present

## 2019-06-04 DIAGNOSIS — M4722 Other spondylosis with radiculopathy, cervical region: Secondary | ICD-10-CM | POA: Diagnosis not present

## 2019-06-04 DIAGNOSIS — M4802 Spinal stenosis, cervical region: Secondary | ICD-10-CM | POA: Diagnosis present

## 2019-06-04 DIAGNOSIS — Z885 Allergy status to narcotic agent status: Secondary | ICD-10-CM | POA: Diagnosis not present

## 2019-06-04 DIAGNOSIS — Z419 Encounter for procedure for purposes other than remedying health state, unspecified: Secondary | ICD-10-CM

## 2019-06-04 DIAGNOSIS — Z833 Family history of diabetes mellitus: Secondary | ICD-10-CM | POA: Insufficient documentation

## 2019-06-04 DIAGNOSIS — Z800 Family history of malignant neoplasm of digestive organs: Secondary | ICD-10-CM | POA: Insufficient documentation

## 2019-06-04 DIAGNOSIS — Z981 Arthrodesis status: Secondary | ICD-10-CM | POA: Insufficient documentation

## 2019-06-04 HISTORY — PX: ANTERIOR CERVICAL DECOMP/DISCECTOMY FUSION: SHX1161

## 2019-06-04 SURGERY — ANTERIOR CERVICAL DECOMPRESSION/DISCECTOMY FUSION 1 LEVEL
Anesthesia: General | Site: Spine Cervical

## 2019-06-04 MED ORDER — LACTATED RINGERS IV SOLN
INTRAVENOUS | Status: DC | PRN
  Administered 2019-06-04 (×2): via INTRAVENOUS

## 2019-06-04 MED ORDER — ACETAMINOPHEN 10 MG/ML IV SOLN
INTRAVENOUS | Status: AC
  Filled 2019-06-04: qty 100

## 2019-06-04 MED ORDER — FENTANYL CITRATE (PF) 250 MCG/5ML IJ SOLN
INTRAMUSCULAR | Status: AC
  Filled 2019-06-04: qty 5

## 2019-06-04 MED ORDER — METHOCARBAMOL 500 MG PO TABS: 500.0000 mg | ORAL_TABLET | Freq: Three times a day (TID) | ORAL | 0 refills | Status: AC | PRN

## 2019-06-04 MED ORDER — HYDROMORPHONE HCL 1 MG/ML IJ SOLN
INTRAMUSCULAR | Status: AC
  Filled 2019-06-04: qty 1

## 2019-06-04 MED ORDER — ONDANSETRON HCL 4 MG/2ML IJ SOLN
INTRAMUSCULAR | Status: AC
  Filled 2019-06-04: qty 2

## 2019-06-04 MED ORDER — LIDOCAINE 2% (20 MG/ML) 5 ML SYRINGE
INTRAMUSCULAR | Status: DC | PRN
  Administered 2019-06-04: 100 mg via INTRAVENOUS

## 2019-06-04 MED ORDER — HYDROCODONE-ACETAMINOPHEN 10-325 MG PO TABS
ORAL_TABLET | ORAL | Status: AC
  Filled 2019-06-04: qty 1

## 2019-06-04 MED ORDER — HYDROCODONE-ACETAMINOPHEN 10-325 MG PO TABS: 1.0000 | ORAL_TABLET | Freq: Four times a day (QID) | ORAL | 0 refills | Status: AC | PRN

## 2019-06-04 MED ORDER — HEMOSTATIC AGENTS (NO CHARGE) OPTIME
TOPICAL | Status: DC | PRN
  Administered 2019-06-04: 1 via TOPICAL

## 2019-06-04 MED ORDER — METHOCARBAMOL 500 MG PO TABS
ORAL_TABLET | ORAL | Status: AC
  Filled 2019-06-04: qty 1

## 2019-06-04 MED ORDER — ROCURONIUM BROMIDE 50 MG/5ML IV SOSY
PREFILLED_SYRINGE | INTRAVENOUS | Status: DC | PRN
  Administered 2019-06-04: 60 mg via INTRAVENOUS
  Administered 2019-06-04 (×2): 20 mg via INTRAVENOUS
  Administered 2019-06-04: 30 mg via INTRAVENOUS

## 2019-06-04 MED ORDER — PROMETHAZINE HCL 25 MG/ML IJ SOLN: 6.2500 mg | INTRAMUSCULAR | Status: DC | PRN

## 2019-06-04 MED ORDER — BUPIVACAINE-EPINEPHRINE 0.25% -1:200000 IJ SOLN
INTRAMUSCULAR | Status: DC | PRN
  Administered 2019-06-04: 5 mL

## 2019-06-04 MED ORDER — LIDOCAINE 2% (20 MG/ML) 5 ML SYRINGE
INTRAMUSCULAR | Status: AC
  Filled 2019-06-04: qty 5

## 2019-06-04 MED ORDER — ROCURONIUM BROMIDE 10 MG/ML (PF) SYRINGE
PREFILLED_SYRINGE | INTRAVENOUS | Status: AC
  Filled 2019-06-04: qty 10

## 2019-06-04 MED ORDER — THROMBIN (RECOMBINANT) 20000 UNITS EX SOLR
CUTANEOUS | Status: AC
  Filled 2019-06-04: qty 20000

## 2019-06-04 MED ORDER — 0.9 % SODIUM CHLORIDE (POUR BTL) OPTIME
TOPICAL | Status: DC | PRN
  Administered 2019-06-04: 09:00:00 1000 mL

## 2019-06-04 MED ORDER — PHENYLEPHRINE 40 MCG/ML (10ML) SYRINGE FOR IV PUSH (FOR BLOOD PRESSURE SUPPORT)
PREFILLED_SYRINGE | INTRAVENOUS | Status: AC
  Filled 2019-06-04: qty 10

## 2019-06-04 MED ORDER — ACETAMINOPHEN 10 MG/ML IV SOLN
INTRAVENOUS | Status: DC | PRN
  Administered 2019-06-04: 1000 mg via INTRAVENOUS

## 2019-06-04 MED ORDER — SUGAMMADEX SODIUM 500 MG/5ML IV SOLN
INTRAVENOUS | Status: AC
  Filled 2019-06-04: qty 5

## 2019-06-04 MED ORDER — METHOCARBAMOL 1000 MG/10ML IJ SOLN: 500.0000 mg | Freq: Four times a day (QID) | INTRAVENOUS | Status: DC | PRN

## 2019-06-04 MED ORDER — MIDAZOLAM HCL 2 MG/2ML IJ SOLN
INTRAMUSCULAR | Status: AC
  Filled 2019-06-04: qty 2

## 2019-06-04 MED ORDER — DEXAMETHASONE SODIUM PHOSPHATE 10 MG/ML IJ SOLN
INTRAMUSCULAR | Status: AC
  Filled 2019-06-04: qty 1

## 2019-06-04 MED ORDER — METHOCARBAMOL 500 MG PO TABS
500.0000 mg | ORAL_TABLET | Freq: Four times a day (QID) | ORAL | Status: DC | PRN
  Administered 2019-06-04: 11:00:00 500 mg via ORAL

## 2019-06-04 MED ORDER — CEFAZOLIN SODIUM-DEXTROSE 2-4 GM/100ML-% IV SOLN
2.0000 g | INTRAVENOUS | Status: AC
  Administered 2019-06-04: 2 g via INTRAVENOUS

## 2019-06-04 MED ORDER — SUGAMMADEX SODIUM 200 MG/2ML IV SOLN
INTRAVENOUS | Status: DC | PRN
  Administered 2019-06-04: 300 mg via INTRAVENOUS

## 2019-06-04 MED ORDER — BUPIVACAINE-EPINEPHRINE (PF) 0.25% -1:200000 IJ SOLN
INTRAMUSCULAR | Status: AC
  Filled 2019-06-04: qty 30

## 2019-06-04 MED ORDER — MEPERIDINE HCL 25 MG/ML IJ SOLN: 6.2500 mg | INTRAMUSCULAR | Status: DC | PRN

## 2019-06-04 MED ORDER — DEXAMETHASONE SODIUM PHOSPHATE 10 MG/ML IJ SOLN
INTRAMUSCULAR | Status: DC | PRN
  Administered 2019-06-04: 10 mg via INTRAVENOUS

## 2019-06-04 MED ORDER — HYDROCODONE-ACETAMINOPHEN 10-325 MG PO TABS
1.0000 | ORAL_TABLET | Freq: Once | ORAL | Status: AC
  Administered 2019-06-04: 11:00:00 1 via ORAL

## 2019-06-04 MED ORDER — PHENYLEPHRINE HCL (PRESSORS) 10 MG/ML IV SOLN
INTRAVENOUS | Status: DC | PRN
  Administered 2019-06-04: 80 ug via INTRAVENOUS

## 2019-06-04 MED ORDER — ONDANSETRON HCL 4 MG/2ML IJ SOLN
INTRAMUSCULAR | Status: DC | PRN
  Administered 2019-06-04: 4 mg via INTRAVENOUS

## 2019-06-04 MED ORDER — MIDAZOLAM HCL 5 MG/5ML IJ SOLN
INTRAMUSCULAR | Status: DC | PRN
  Administered 2019-06-04 (×2): 1 mg via INTRAVENOUS

## 2019-06-04 MED ORDER — PROPOFOL 10 MG/ML IV BOLUS
INTRAVENOUS | Status: AC
  Filled 2019-06-04: qty 40

## 2019-06-04 MED ORDER — HYDROMORPHONE HCL 1 MG/ML IJ SOLN
0.2500 mg | INTRAMUSCULAR | Status: DC | PRN
  Administered 2019-06-04 (×2): 0.5 mg via INTRAVENOUS

## 2019-06-04 MED ORDER — FENTANYL CITRATE (PF) 100 MCG/2ML IJ SOLN
INTRAMUSCULAR | Status: DC | PRN
  Administered 2019-06-04 (×3): 50 ug via INTRAVENOUS
  Administered 2019-06-04: 100 ug via INTRAVENOUS
  Administered 2019-06-04: 50 ug via INTRAVENOUS

## 2019-06-04 MED ORDER — ONDANSETRON HCL 4 MG PO TABS: 4.0000 mg | ORAL_TABLET | Freq: Three times a day (TID) | ORAL | 0 refills | Status: DC | PRN

## 2019-06-04 MED ORDER — PROPOFOL 10 MG/ML IV BOLUS
INTRAVENOUS | Status: DC | PRN
  Administered 2019-06-04: 40 mg via INTRAVENOUS
  Administered 2019-06-04: 200 mg via INTRAVENOUS
  Administered 2019-06-04: 50 mg via INTRAVENOUS

## 2019-06-04 MED ORDER — ONDANSETRON HCL 4 MG/2ML IJ SOLN
4.0000 mg | Freq: Once | INTRAMUSCULAR | Status: AC
  Administered 2019-06-04: 13:00:00 4 mg via INTRAVENOUS

## 2019-06-04 SURGICAL SUPPLY — 67 items
BIT DRILL SKYLINE 12MM (BIT) IMPLANT
BLADE CLIPPER SURG (BLADE) ×2 IMPLANT
BONE VIVIGEN FORMABLE 1.3CC (Bone Implant) ×3 IMPLANT
CABLE BIPOLOR RESECTION CORD (MISCELLANEOUS) ×3 IMPLANT
CANISTER SUCT 3000ML PPV (MISCELLANEOUS) ×3 IMPLANT
CLOSURE STERI-STRIP 1/2X4 (GAUZE/BANDAGES/DRESSINGS) ×1
CLSR STERI-STRIP ANTIMIC 1/2X4 (GAUZE/BANDAGES/DRESSINGS) ×2 IMPLANT
COVER MAYO STAND STRL (DRAPES) ×9 IMPLANT
COVER SURGICAL LIGHT HANDLE (MISCELLANEOUS) ×6 IMPLANT
COVER WAND RF STERILE (DRAPES) ×3 IMPLANT
DECANTER SPIKE VIAL GLASS SM (MISCELLANEOUS) ×2 IMPLANT
DEVICE ENDSKLTN TC IMPLANT 8MM (Spacer) IMPLANT
DRAPE C-ARM 42X72 X-RAY (DRAPES) ×3 IMPLANT
DRAPE POUCH INSTRU U-SHP 10X18 (DRAPES) ×3 IMPLANT
DRAPE SURG 17X23 STRL (DRAPES) ×3 IMPLANT
DRAPE U-SHAPE 47X51 STRL (DRAPES) ×3 IMPLANT
DRILL BIT SKYLINE 12MM (BIT) ×2
DRSG OPSITE POSTOP 3X4 (GAUZE/BANDAGES/DRESSINGS) ×3 IMPLANT
DURAPREP 26ML APPLICATOR (WOUND CARE) ×3 IMPLANT
ELECT COATED BLADE 2.86 ST (ELECTRODE) ×3 IMPLANT
ELECT PENCIL ROCKER SW 15FT (MISCELLANEOUS) ×3 IMPLANT
ELECT REM PT RETURN 9FT ADLT (ELECTROSURGICAL) ×3
ELECTRODE REM PT RTRN 9FT ADLT (ELECTROSURGICAL) ×1 IMPLANT
ENDOSKELETON TC IMPLANT 8MM (Spacer) ×3 IMPLANT
GLOVE BIO SURGEON STRL SZ 6.5 (GLOVE) ×2 IMPLANT
GLOVE BIO SURGEONS STRL SZ 6.5 (GLOVE) ×1
GLOVE BIOGEL PI IND STRL 6.5 (GLOVE) ×1 IMPLANT
GLOVE BIOGEL PI IND STRL 8.5 (GLOVE) ×1 IMPLANT
GLOVE BIOGEL PI INDICATOR 6.5 (GLOVE) ×2
GLOVE BIOGEL PI INDICATOR 8.5 (GLOVE) ×2
GLOVE SS BIOGEL STRL SZ 8.5 (GLOVE) ×1 IMPLANT
GLOVE SUPERSENSE BIOGEL SZ 8.5 (GLOVE) ×2
GOWN STRL REUS W/ TWL LRG LVL3 (GOWN DISPOSABLE) ×1 IMPLANT
GOWN STRL REUS W/TWL 2XL LVL3 (GOWN DISPOSABLE) ×3 IMPLANT
GOWN STRL REUS W/TWL LRG LVL3 (GOWN DISPOSABLE) ×6
GRAFT BNE MATRIX VG FRMBL SM 1 (Bone Implant) IMPLANT
KIT BASIN OR (CUSTOM PROCEDURE TRAY) ×3 IMPLANT
KIT TURNOVER KIT B (KITS) ×3 IMPLANT
NDL SPNL 18GX3.5 QUINCKE PK (NEEDLE) ×1 IMPLANT
NEEDLE HYPO 22GX1.5 SAFETY (NEEDLE) ×3 IMPLANT
NEEDLE SPNL 18GX3.5 QUINCKE PK (NEEDLE) ×3 IMPLANT
NS IRRIG 1000ML POUR BTL (IV SOLUTION) ×3 IMPLANT
PACK ORTHO CERVICAL (CUSTOM PROCEDURE TRAY) ×3 IMPLANT
PACK UNIVERSAL I (CUSTOM PROCEDURE TRAY) ×3 IMPLANT
PAD ARMBOARD 7.5X6 YLW CONV (MISCELLANEOUS) ×6 IMPLANT
PATTIES SURGICAL .25X.25 (GAUZE/BANDAGES/DRESSINGS) IMPLANT
PIN DISTRACTION 14 (PIN) ×4 IMPLANT
PLATE ONE LEVEL SKYLINE 14MM (Plate) ×2 IMPLANT
POSITIONER HEAD DONUT 9IN (MISCELLANEOUS) ×3 IMPLANT
PUTTY DBX 1CC (Putty) ×3 IMPLANT
PUTTY DBX 1CC DEPUY (Putty) IMPLANT
RESTRAINT LIMB HOLDER UNIV (RESTRAINTS) ×3 IMPLANT
SCREW SKYLINE 16MM (Screw) ×8 IMPLANT
SPONGE INTESTINAL PEANUT (DISPOSABLE) ×3 IMPLANT
SPONGE SURGIFOAM ABS GEL SZ50 (HEMOSTASIS) ×3 IMPLANT
SURGIFLO W/THROMBIN 8M KIT (HEMOSTASIS) ×2 IMPLANT
SUT BONE WAX W31G (SUTURE) ×3 IMPLANT
SUT MON AB 3-0 SH 27 (SUTURE) ×2
SUT MON AB 3-0 SH27 (SUTURE) ×1 IMPLANT
SUT VIC AB 2-0 CT1 18 (SUTURE) ×3 IMPLANT
SYR BULB IRRIGATION 50ML (SYRINGE) ×3 IMPLANT
SYR CONTROL 10ML LL (SYRINGE) ×3 IMPLANT
TAPE CLOTH 4X10 WHT NS (GAUZE/BANDAGES/DRESSINGS) ×3 IMPLANT
TAPE UMBILICAL COTTON 1/8X30 (MISCELLANEOUS) ×3 IMPLANT
TOWEL GREEN STERILE (TOWEL DISPOSABLE) ×3 IMPLANT
TOWEL GREEN STERILE FF (TOWEL DISPOSABLE) ×3 IMPLANT
WATER STERILE IRR 1000ML POUR (IV SOLUTION) ×3 IMPLANT

## 2019-06-04 NOTE — Brief Op Note (Signed)
06/04/2019  10:48 AM  PATIENT:  George Lawrence  53 y.o. 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

## 2019-06-04 NOTE — Anesthesia Postprocedure Evaluation (Signed)
Anesthesia Post Note  Patient: George Lawrence  Procedure(s) Performed: ANTERIOR CERVICAL DECOMPRESSION/DISCECTOMY FUSION CERVICAL SIX-CERVICAL SEVEN (N/A Spine Cervical)     Patient location during evaluation: PACU Anesthesia Type: General Level of consciousness: sedated and patient cooperative Pain management: pain level controlled Vital Signs Assessment: post-procedure vital signs reviewed and stable Respiratory status: spontaneous breathing Cardiovascular status: stable Anesthetic complications: no    Last Vitals:  Vitals:   06/04/19 1200 06/04/19 1212  BP: (!) 135/97 (!) 137/97  Pulse:  80  Resp:    Temp: (!) 36.3 C (!) 36.3 C  SpO2:  93%    Last Pain:  Vitals:   06/04/19 1212  TempSrc:   PainSc: 0-No pain                 Nolon Nations

## 2019-06-04 NOTE — Op Note (Signed)
Operative report  Preoperative diagnosis: Cervical spondylitic radiculopathy C7  Postoperative diagnosis: Same  Operative procedure: ACDF C6-7  First Assistant: Glynis SmilesAmanda Ward, PA  Complications: None  Implants: Medtronic Nano lock tenia intervertebral cage.  8 mm large lordotic cage.  Depuy anterior cervical skyline plate 14 mm length, with 16 mm length screws.  Allograft: vivogen  Indications: George DowseJoel is a very pleasant 53 year old gentleman presented my care with significant neck and neuropathic left arm pain.  Imaging studies confirmed spondylitic foraminal stenosis with C7 nerve compression.  Attempts at conservative management had failed to alleviate his symptoms and so he elected to move forward with surgery.  All appropriate risks benefits and alternatives were discussed with the patient and consent was obtained.  Operative report: Patient was brought the operating room placed by the operating table.  After successful induction of general anesthesia and endotracheal ovation teds SCDs were applied and the anterior cervical spine was prepped and draped in a standard fashion.  Timeout was taken to confirm patient procedure and all other important data.  Intraoperative fluoroscopy was then used to identify the C6-7 disc space and the skin incision was marked out.  I infiltrated the incision site with quarter percent Marcaine with epinephrine.  Transverse incision was made starting for the midline and proceeding to the left.  Sharp dissection was carried down to and through the platysma  Identified the cleidomastoid and began dissecting along the medial border into the deep cervical and prevertebral fascia.  The omohyoid was divided isolated and then transected for improved visualization.  I eventually was able to bluntly dissect through the remainder of the prevertebral fascia and sweep the esophagus to the right-hand side.  Hand-held retractor was then placed to protect it.  I palpated the carotid  sheath and identified it directly and protected it with a finger.  Was a standard Smith-Robinson approach to the anterior cervical spine.  A needle was placed into the C6-7 disc space and a second intraoperative fluoroscopy view was taken to confirm is at the appropriate level.  Once I confirmed my level I then used bipolar electrocautery to mobilize the longus coli muscle from the superior aspect of C6 to the inferior aspect of C7.  Once this was done bilaterally I placed my self-retaining retractors underneath the longus coli muscle deflated the endotracheal cuff expanded the retractor and then reinflated the cuff.  An annulotomy was then performed with a 15 blade scalpel and I began removing the bulk of the disc material with pituitary Roger.  The overhanging osteophyte from the inferior aspect of the C6 vertebral body was taken down with a 2 mm Kerrison punch.  Distraction pins were then placed into the midportion of the body of C6 and C7 and then I gently distracted the intervertebral space with a lamina spreader.  I maintained the distraction with the retractor and this allowed me to continue to remove the disc material.  Using a curette I began removing all the disc material as well as the cartilaginous endplate.  I eventually was down to the posterior aspect of the disc space.  I then used my fine nerve hook to develop a plane under the PLL and used a 1 mm Kerrison punch to resect the posterior longitudinal ligament.  In the left-hand corner there was a large osteophytic bone spur which I ultimately was able to remove.  Using my nerve hook I mobilized it and then removed it.  This allowed me to place my 1 mm Kerrison under  the uncovertebral joint and resect the osteophytes.  At this point using x-ray to confirm that an adequate posterior lateral left-sided decompression.  I could easily pass my nerve hook superiorly and inferiorly and underneath the uncovertebral joint confirming satisfactory neural  decompression.  I then resected going to the right and again took down the osteophyte from the uncovertebral joint.  At this point I was pleased with the overall decompression.  Under live fluoroscopy I expanded and relax the intervertebral space with a lamina spreader to confirm I had parallel endplate distraction.  Using trial implants I elected to use the size 8 mm lordotic implant.  Provided the best overall distraction of the intervertebral space and maintain the indirect foraminal decompression.  I obtained the large 8 mm cage packed it with allograft and malleted to the appropriate depth.  Once the cage was properly seated and confirmed with lateral fluoroscopy I then placed the anterior cervical plate and affixed it with 16 mm length screws.  All 4 screws had excellent purchase.  I then locked the screws to the plate according manufacturer standards.  The wound was copiously irrigated and all retractors were removed.  X-rays were then taken.  In the AP view the cage was slightly off to the left but I felt as though it was stable and had excellent fixation as I did not want to revise it and risk weakening the plate fixation.  The wound was copiously irrigated and hemostasis was obtained using bipolar cautery and FloSeal.  After hemostasis was obtained I re-irrigated out the wound and returned the trachea esophagus to midline.  I closed the platysma with interrupted 2-0 Vicryl suture and the skin with a 3-0 Monocryl.  Steri-Strips dry dressing and an Aspen collar were applied and was transferred to the PACU after extubation.  The end of the case all needle sponge counts were correct there were no adverse intraoperative events.

## 2019-06-04 NOTE — Anesthesia Procedure Notes (Signed)
Procedure Name: Intubation Date/Time: 06/04/2019 7:44 AM Performed by: Scheryl Darter, CRNA Pre-anesthesia Checklist: Patient identified, Emergency Drugs available, Suction available and Patient being monitored Patient Re-evaluated:Patient Re-evaluated prior to induction Oxygen Delivery Method: Circle System Utilized Preoxygenation: Pre-oxygenation with 100% oxygen Induction Type: IV induction Ventilation: Mask ventilation without difficulty Laryngoscope Size: Miller and 3 Grade View: Grade I Tube type: Oral Tube size: 7.5 mm Number of attempts: 1 Airway Equipment and Method: Stylet Placement Confirmation: ETT inserted through vocal cords under direct vision,  positive ETCO2 and breath sounds checked- equal and bilateral Secured at: 23 cm Tube secured with: Tape Dental Injury: Teeth and Oropharynx as per pre-operative assessment

## 2019-06-04 NOTE — Discharge Instructions (Signed)
[facility]   Today you will be discharged from the hospital.  The purpose of the following handout is to help guide you over the next 2 weeks.  First and foremost, be sure you have a follow up appointment with Dr. Shon BatonBrooks 2 weeks from the time of your surgery to have your sutures removed.  Please call [facility] [phone] to schedule or confirm this appointment.      Brace You do not have to wear the collar while lying in bed or sitting in a high-backed chair, eating, sleeping or showering.  Other than these instances, you must wear the brace.  You may NOT wear the collar while driving a vehicle (see driving restrictions below).  It is advisable that you wear the collar in public places or while traveling in a car as a passenger.  Dr. Shon BatonBrooks will discuss further use of the collar at your 2 week postop visit.  Wound Care You may SHOWER 5 days from the date of surgery.  Shower directly over the steri-strips.  DO NOT scrub or submerge (bath tub, swimming pool, hot tub, etc.) the area.  Pat to dry following your shower.  There is no need for additional dressings other than the steri-strips.  Allow the steri-strips to fall off on their own.  Once the strips have fallen off, you may leave the area undressed.  DO NOT apply lotion/cream/ointment to the area.  The wound must remain dry at all times other than while showering.  Dr. Shon BatonBrooks or his staff will remove your stiches at your first postop visit and give you additional instructions regarding wound care at that time.   Activity NO DRIVING FOR 2 WEEKS.  No lifting over 5 pounds (approximately a gallon of milk).  No bending, stooping, squatting or twisting.  No overhead activities.  We encourage you to walk (short distances and often throughout the day) as you can tolerate.  A good rule of thumb is to get up and move once or twice every hour.  You may go up and down stairs carefully.  As you  continue to recover, Dr. Shon BatonBrooks will address and adjust restrictions to your activities until no further restrictions are needed.  However, until your first postop visit, when Dr. Shon BatonBrooks can assess your recovery, you are to follow these instructions.  At the end of this document is a tentative outline of activities for up to 1 year.       Medication You will be discharged from the hospital with medication for pain, spasm, nausea and constipation.  You will be given enough medication to last until your first postop visit in 2 weeks.  Medications WILL NOT BE REFILLED EARLY; therefore, you are to take the medications only as directed.  If you have been given multiple prescriptions, please leave them with your pharmacy.  They can keep them on file for when you need them.  Medications that are lost or stolen WILL NOT be replaced.  We will address the need for continuing certain medications on an individual basis during your postop visit.  We ask that you avoid over the counter anti-inflammatory medications (Advil, Aleve, Motrin) for 3 months.    What you can expect following neck surgery... It is not uncommon to experience a sore throat or difficulty swallowing following neck surgery.  Cold liquids and soft foods are helpful in soothing this discomfort.  There is no specific diet that you are to follow after surgery, however,  there are a few things you should keep in mind to avoid unneeded discomfort.  Take small bites and eat slowly.  Chew your food thoroughly before swallowing.   It is not uncommon to experience incisional soreness or pain in the back of the neck, shoulders or between the shoulder blades.  These symptoms will slowly begin to resolve as you continue to recover, however, they can last for a few weeks.    It is not uncommon to experience INTERMITTENT arm pain following surgery.  This pain can mimic the arm pain you had prior to surgery.  As long as the pain resolves on its own and is not  constant, there is no need to become alarmed.   When To Call If you experience fever >101F, loss of bowel or bladder control, painful swelling in the lower extremities, constant (unresolving) arm pain.  If you experience any of these symptoms, please call [facility] [phone].  What's Next As mentioned earlier, you will follow up with Dr. Rolena Infante in 2 weeks.  At that time, we will likely remove your stitches and discuss additional aspects of your recovery.                   ACTIVITY GUIDELINES ANTERIOR CERVICAL DISECTOMY AND FUSION  Activity Discharge 2 weeks 6 weeks 3 months 6 months 1 year  Shower 5 days        Submerge the wound  no no yes     Walking outdoors yes       Lifting 5 lbs yes       Climbing stairs yes       Cooking yes       Car rides (less than 30 minutes) yes       Car rides (greater than 30 minutes) no varies yes     Air travel no varies yes     Short outings [employer], visits, etc...) yes       School no no yes     Driving a car no no varies yes    Light upper extremity exercises no no varies yes    Stationary bike no no yes     Swimming (no diving) no no no varies yes   Vacuuming, laundry, mopping no no no varies yes   Biking outdoors no no no no varies yes  Light jogging no no no varies yes   Low impact aerobics no no no varies yes   Non-contact sports (tennis, golf) no no no varies yes   Hunting (no tree climbing) no no no varies yes   Dancing (non-gymnastics) no no no varies yes   Down-hill skiing (experienced skier) no no no no yes   Down-hill skiing (novice) no no no no yes   Cross-country skiing no no no no yes   Horseback riding (noncompetitive)  no no no no yes   Horseback riding (competitive) no no no no varies yes  Gardening/landscaping no no no varies yes   House repairs no no no varies varies yes  Lifting up to 50 lbs no no no no varies yes

## 2019-06-04 NOTE — Progress Notes (Signed)
Pt OOB, ambulated & tol. very well. Plan is for pt to DC to home from PACU per Dr Rolena Infante. Dr Lissa Hoard fully updated. Pt to Phase 2.

## 2019-06-04 NOTE — Transfer of Care (Signed)
Immediate Anesthesia Transfer of Care Note  Patient: George Lawrence  Procedure(s) Performed: ANTERIOR CERVICAL DECOMPRESSION/DISCECTOMY FUSION CERVICAL SIX-CERVICAL SEVEN (N/A Spine Cervical)  Patient Location: PACU  Anesthesia Type:General  Level of Consciousness: awake, alert , oriented and sedated  Airway & Oxygen Therapy: Patient Spontanous Breathing and Patient connected to nasal cannula oxygen  Post-op Assessment: Report given to RN, Post -op Vital signs reviewed and stable and Patient moving all extremities  Post vital signs: Reviewed and stable  Last Vitals:  Vitals Value Taken Time  BP 131/96 06/04/19 1053  Temp    Pulse 91 06/04/19 1056  Resp 18 06/04/19 1056  SpO2 96 % 06/04/19 1056  Vitals shown include unvalidated device data.  Last Pain:  Vitals:   06/04/19 0554  TempSrc:   PainSc: 5          Complications: No apparent anesthesia complications

## 2019-06-04 NOTE — Progress Notes (Signed)
Gave discharge instructions to his wife Lala Lund, she stated she was at work and would take an hour to get here.

## 2019-06-04 NOTE — H&P (Signed)
Addendum H+P  No change to clinical exam since last office visit on 05/26/19 Patient continues to left C7 radicular pain and numbness.  Plan on ACDF C6/7 for C7 radiculopathy  Surgical plan was discussed with patient along with risks/benefits.  All questions encouraged and addressed.

## 2019-06-05 MED FILL — Thrombin (Recombinant) For Soln 20000 Unit: CUTANEOUS | Qty: 1 | Status: AC

## 2019-06-09 ENCOUNTER — Encounter (HOSPITAL_COMMUNITY): Payer: Self-pay | Admitting: Orthopedic Surgery

## 2019-12-05 IMAGING — DX DG THORACIC SPINE 2V
3 series · 3 of 3 positions shown · non-contrast
Comparison: None.

CLINICAL DATA: Patient was working with hand truck yesterday with
pain of the neck between the shoulder blades.

EXAM:
THORACIC SPINE 2 VIEWS

[t-spine ap]
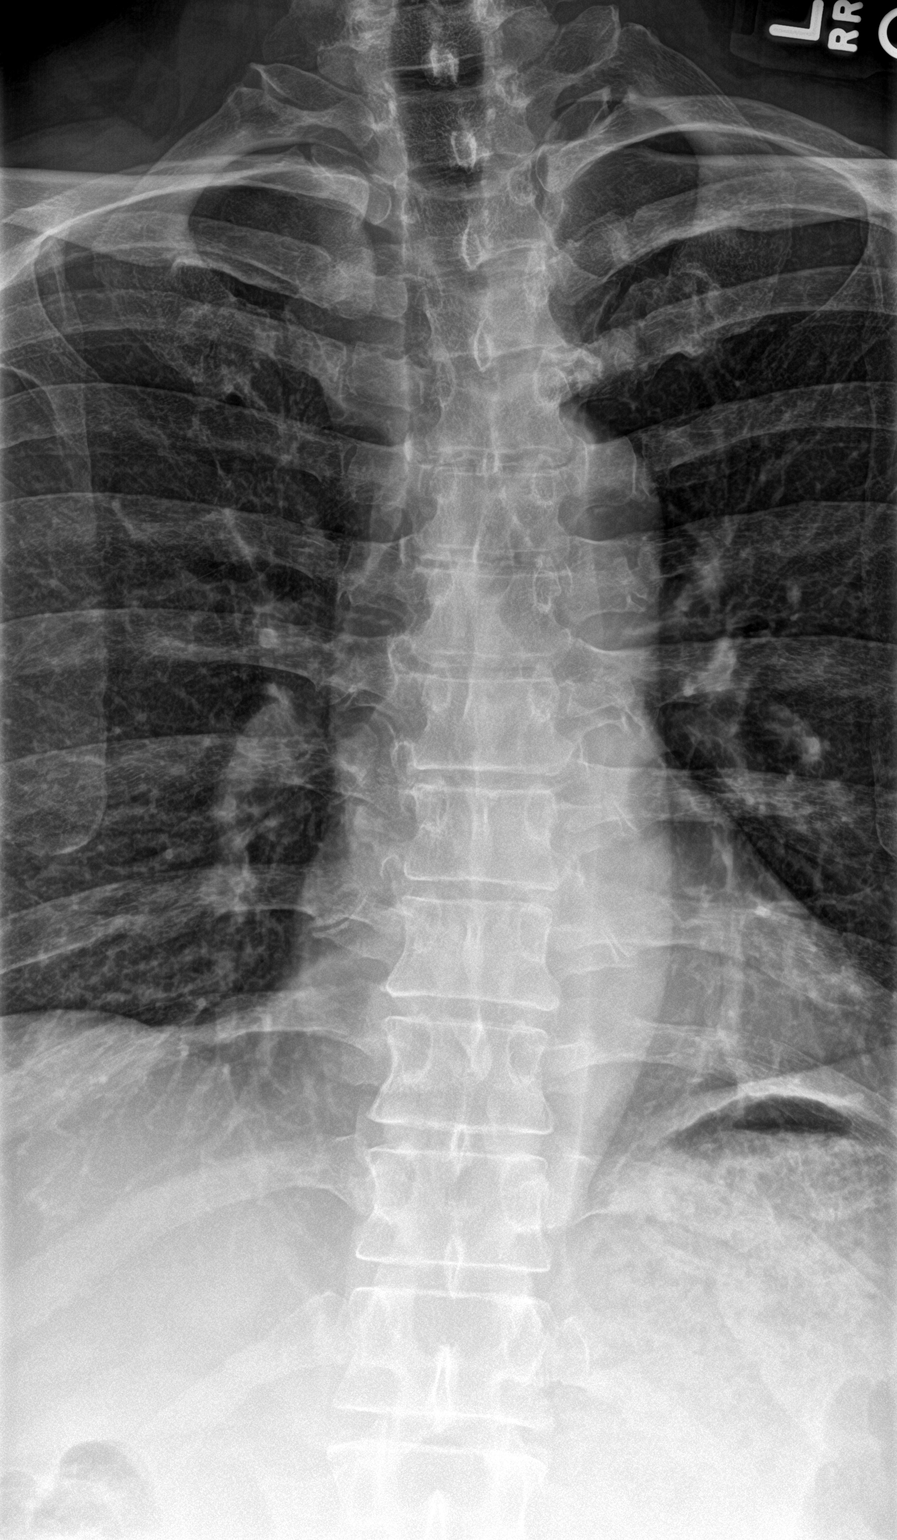

[t-spine lat (1 of 2)]
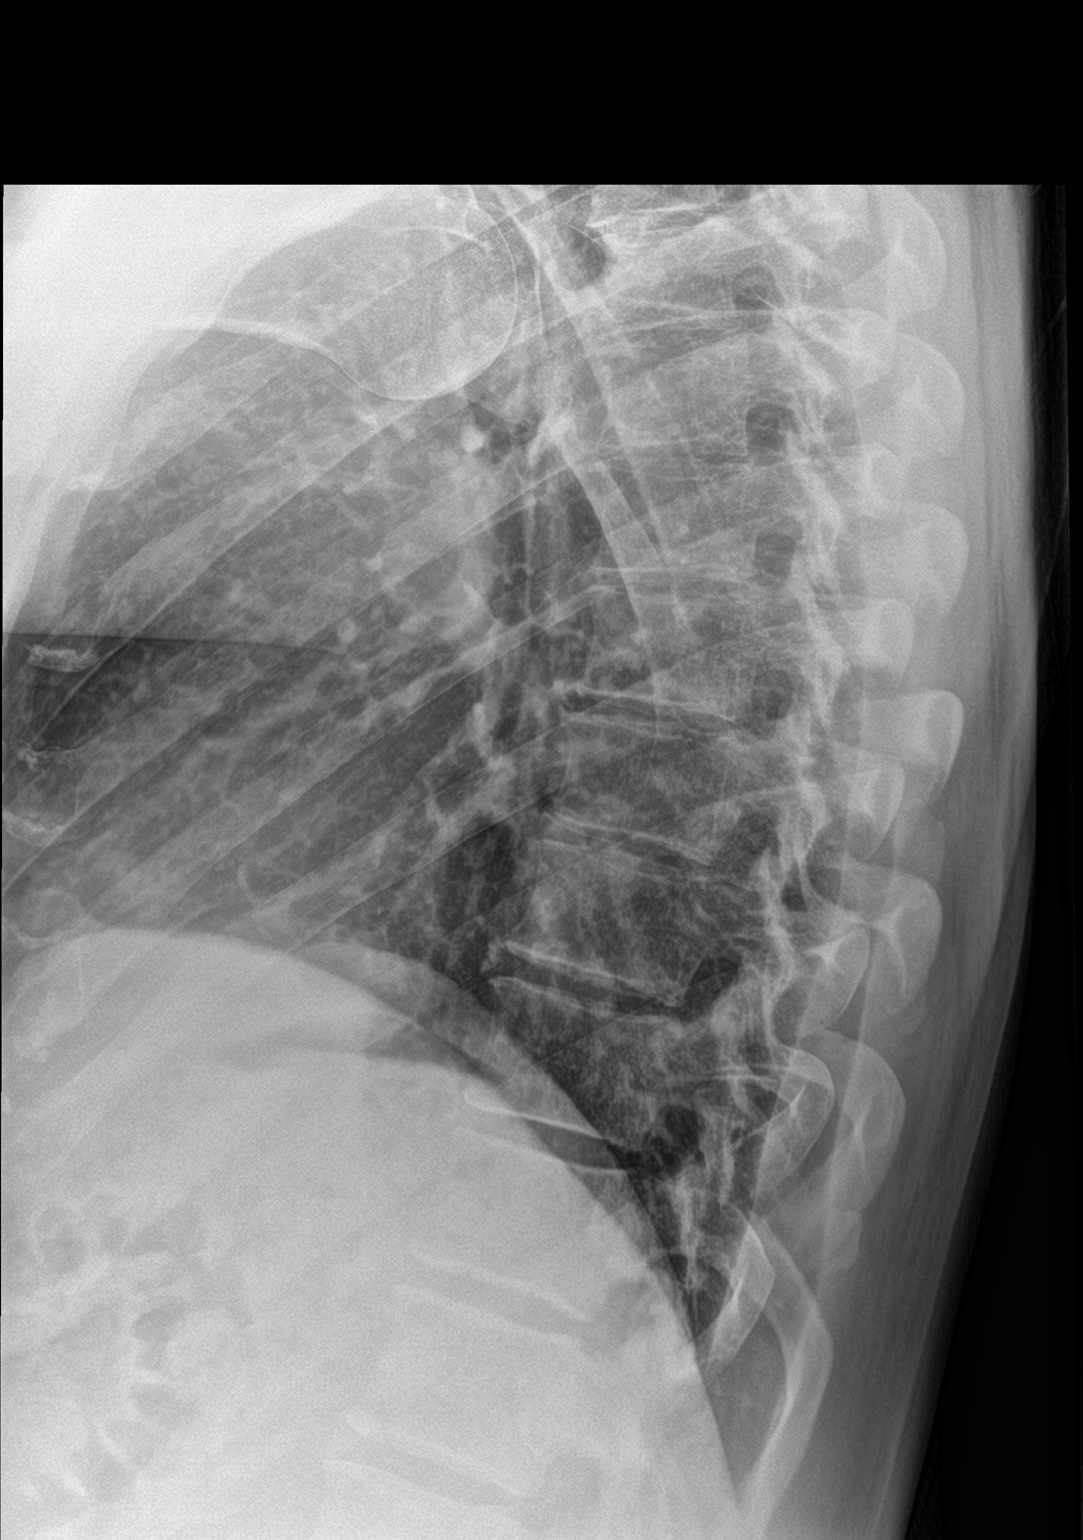

[t-spine lat (2 of 2)]
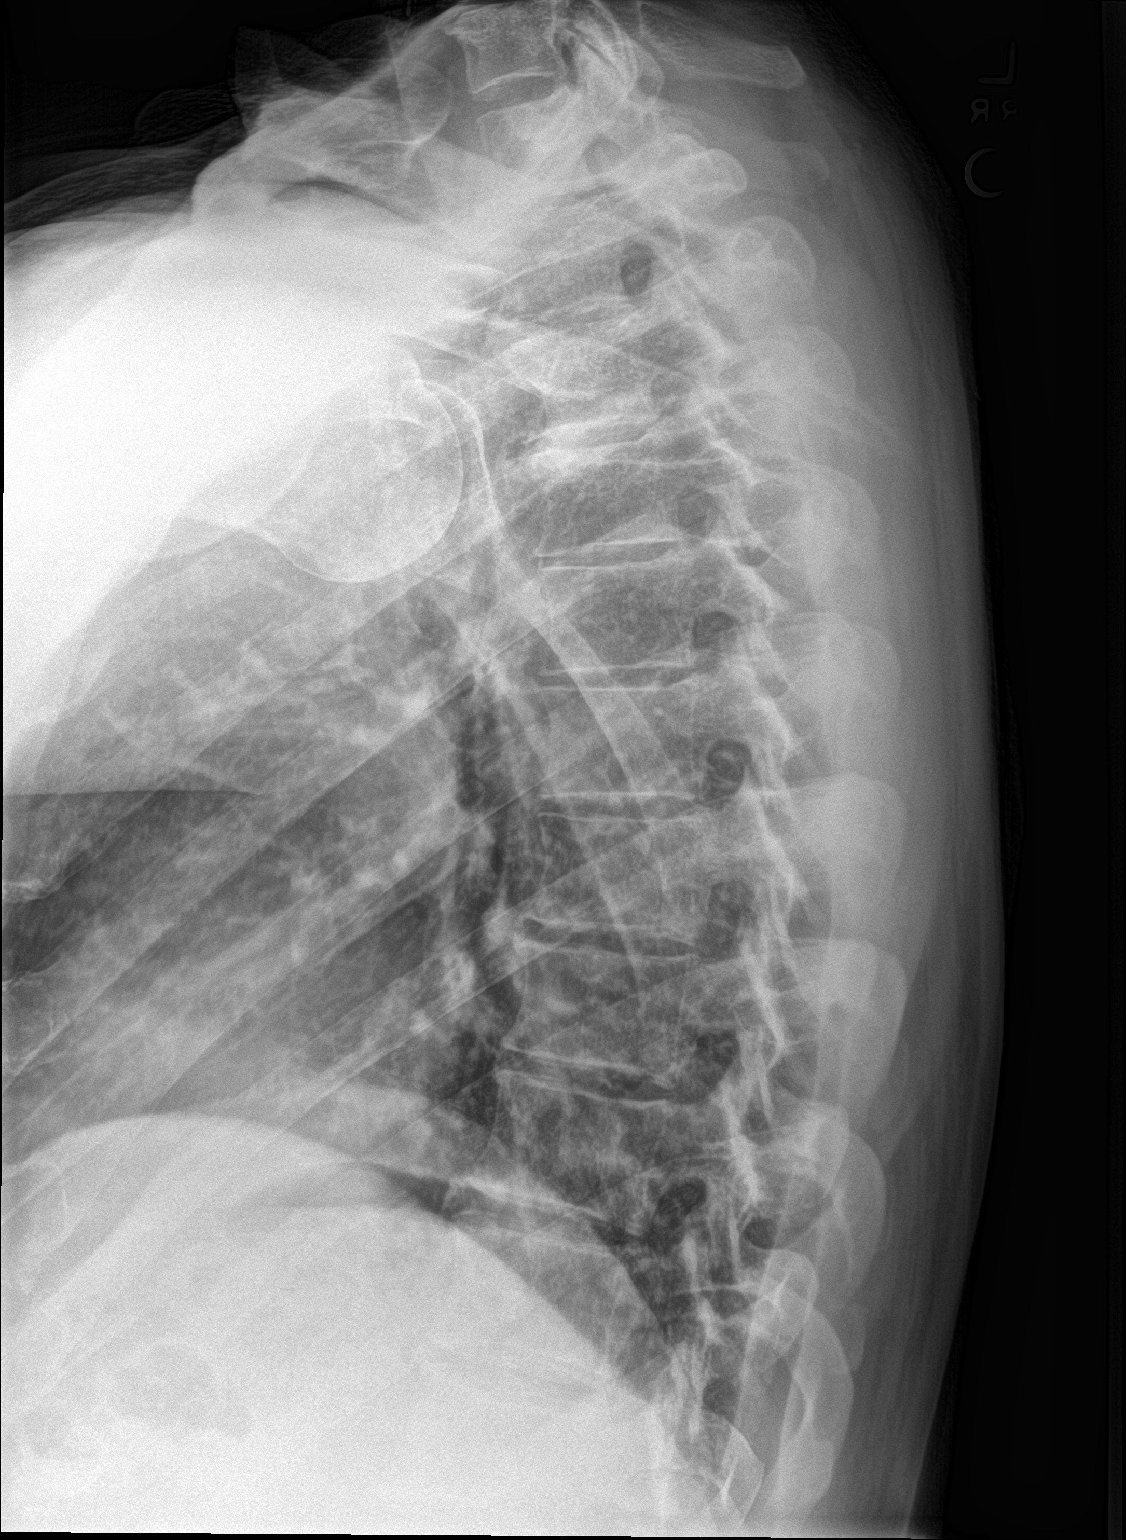

[3 of 3 positions shown; findings below may reference images not displayed]

FINDINGS: There is no evidence of thoracic spine fracture. Alignment is
normal. No other significant bone abnormalities are identified.
IMPRESSION: Negative.

## 2020-04-08 IMAGING — RF CERVICAL SPINE - 2-3 VIEW
1 series · 4 of 4 positions shown · non-contrast
Comparison: Plain film cervical spine 01/30/2019.

CLINICAL DATA: Intraoperative imaging for cervical discectomy and
fusion.

EXAM:
DG C-ARM 61-120 MIN; CERVICAL SPINE - 2-3 VIEW

[Series 1: run · 4 of 4 slices shown]
[im 1/4]
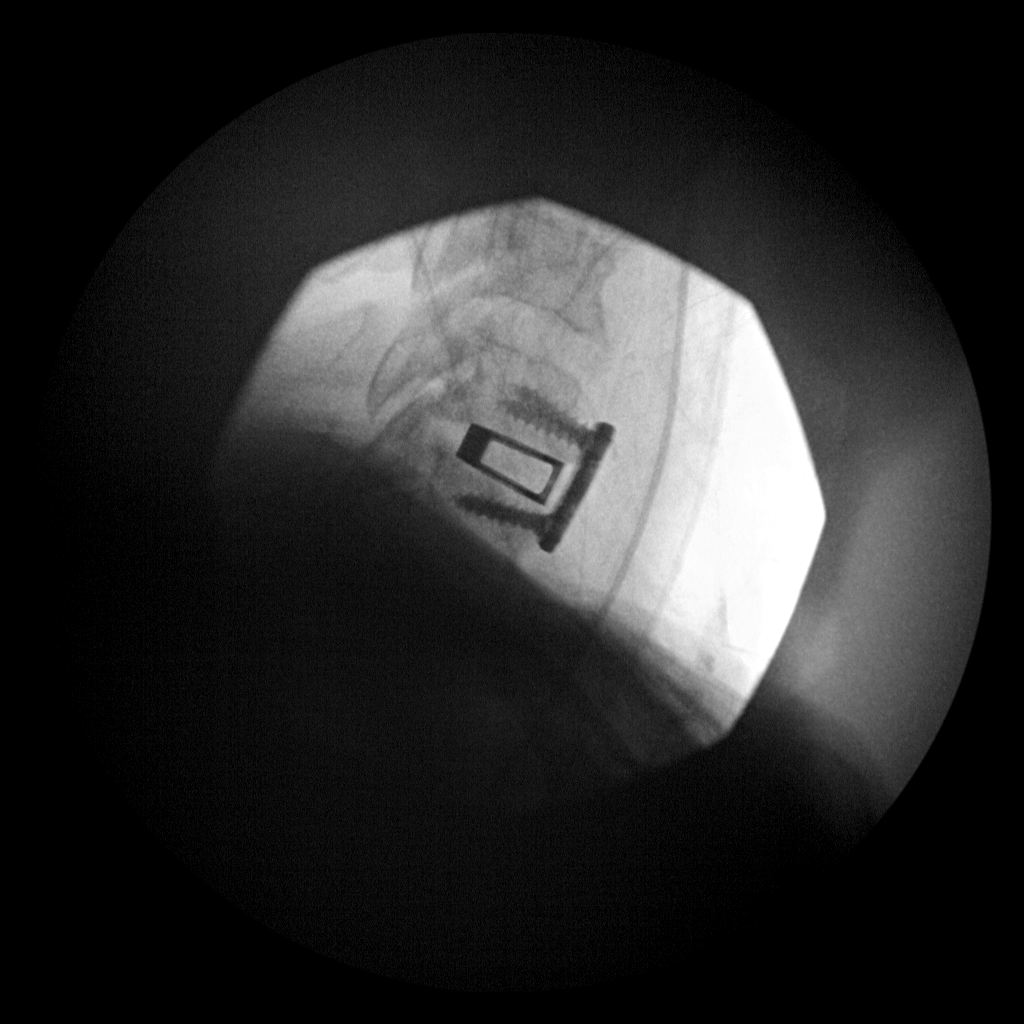
[im 2/4]
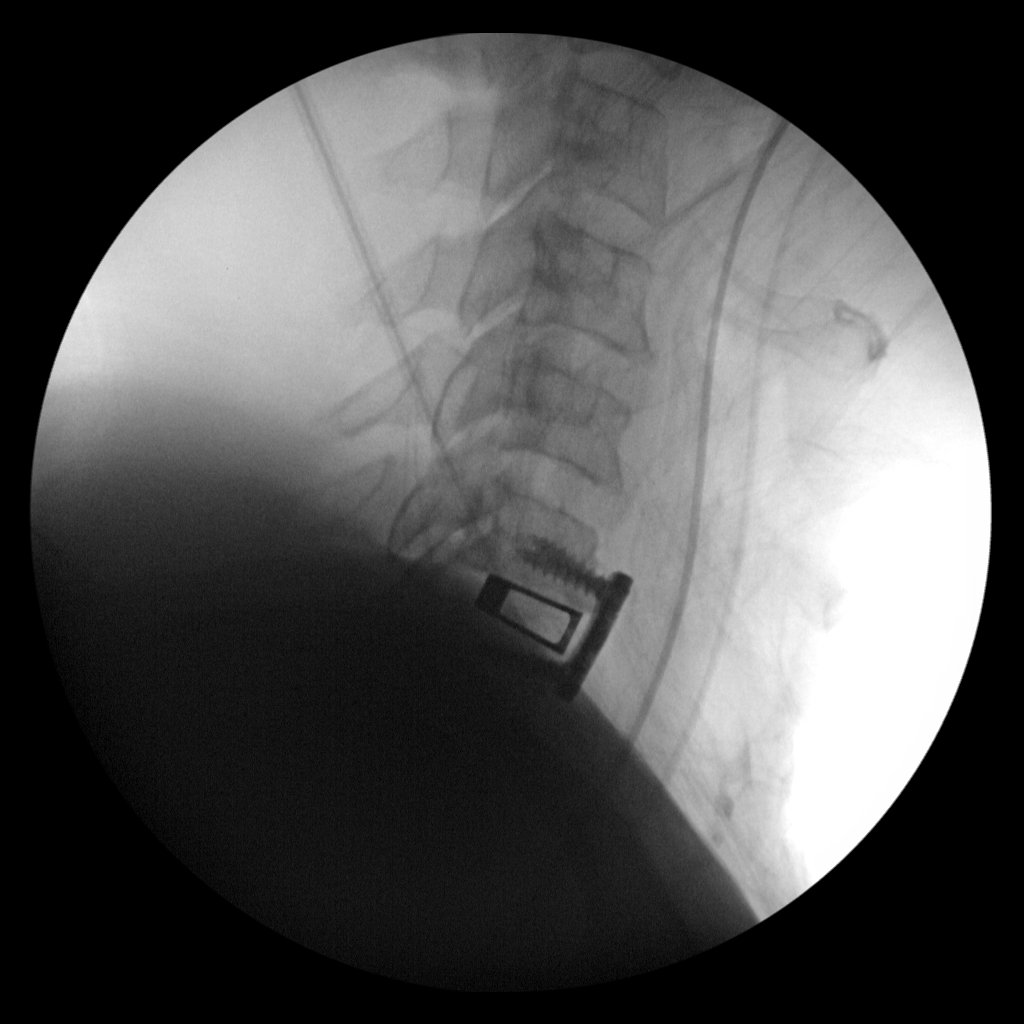
[im 3/4]
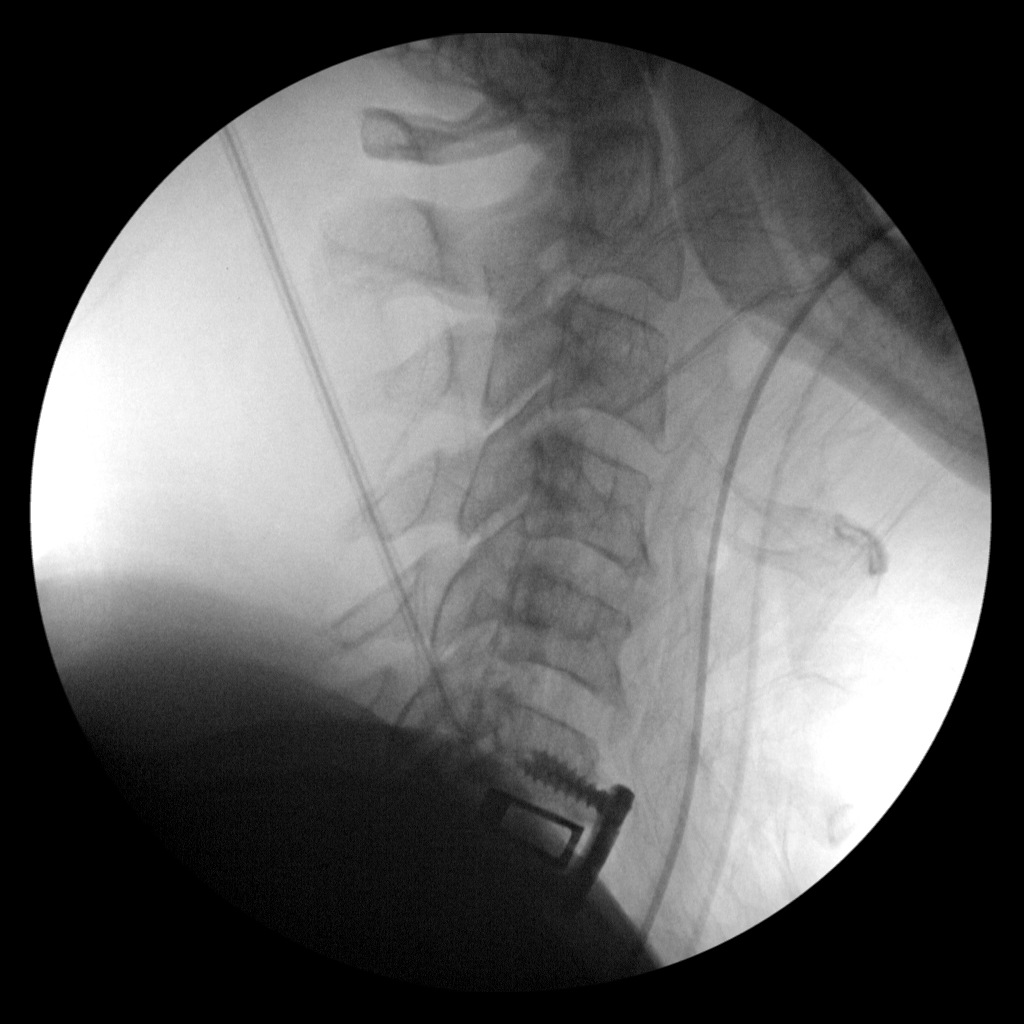
[im 4/4]
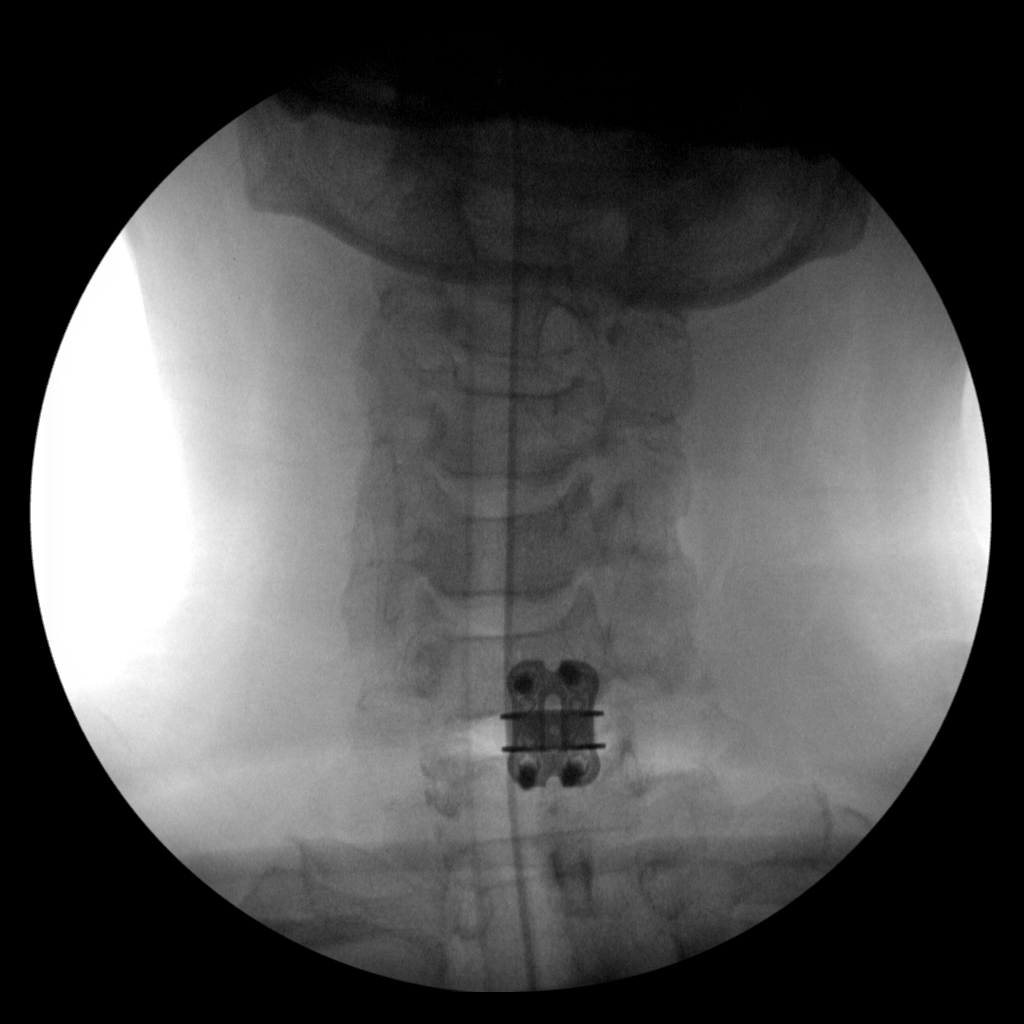

[4 of 4 positions shown; findings below may reference images not displayed]

FINDINGS: Four fluoroscopic spot views of the cervical spine demonstrate
anterior plate and screws and interbody spacer at C6-7. No acute
abnormality is identified.
IMPRESSION: Intraoperative imaging for C6-7 ACDF.  No acute finding.

## 2022-12-25 DIAGNOSIS — Z0100 Encounter for examination of eyes and vision without abnormal findings: Secondary | ICD-10-CM | POA: Diagnosis not present

## 2022-12-25 DIAGNOSIS — H524 Presbyopia: Secondary | ICD-10-CM | POA: Diagnosis not present

## 2023-03-04 DIAGNOSIS — Z125 Encounter for screening for malignant neoplasm of prostate: Secondary | ICD-10-CM | POA: Diagnosis not present

## 2023-03-04 DIAGNOSIS — E7800 Pure hypercholesterolemia, unspecified: Secondary | ICD-10-CM | POA: Diagnosis not present

## 2023-03-04 DIAGNOSIS — M5090 Cervical disc disorder, unspecified, unspecified cervical region: Secondary | ICD-10-CM | POA: Diagnosis not present

## 2023-03-04 DIAGNOSIS — E663 Overweight: Secondary | ICD-10-CM | POA: Diagnosis not present

## 2023-03-04 DIAGNOSIS — Z0000 Encounter for general adult medical examination without abnormal findings: Secondary | ICD-10-CM | POA: Diagnosis not present

## 2023-03-04 DIAGNOSIS — Z5181 Encounter for therapeutic drug level monitoring: Secondary | ICD-10-CM | POA: Diagnosis not present

## 2024-03-12 DIAGNOSIS — Z5181 Encounter for therapeutic drug level monitoring: Secondary | ICD-10-CM | POA: Diagnosis not present

## 2024-03-12 DIAGNOSIS — E7800 Pure hypercholesterolemia, unspecified: Secondary | ICD-10-CM | POA: Diagnosis not present

## 2024-03-12 DIAGNOSIS — Z125 Encounter for screening for malignant neoplasm of prostate: Secondary | ICD-10-CM | POA: Diagnosis not present

## 2024-07-07 DIAGNOSIS — C44612 Basal cell carcinoma of skin of right upper limb, including shoulder: Secondary | ICD-10-CM | POA: Diagnosis not present

## 2024-07-07 DIAGNOSIS — D1801 Hemangioma of skin and subcutaneous tissue: Secondary | ICD-10-CM | POA: Diagnosis not present

## 2024-07-07 DIAGNOSIS — D229 Melanocytic nevi, unspecified: Secondary | ICD-10-CM | POA: Diagnosis not present

## 2024-07-07 DIAGNOSIS — L814 Other melanin hyperpigmentation: Secondary | ICD-10-CM | POA: Diagnosis not present

## 2024-07-07 DIAGNOSIS — L578 Other skin changes due to chronic exposure to nonionizing radiation: Secondary | ICD-10-CM | POA: Diagnosis not present

## 2024-07-07 DIAGNOSIS — L570 Actinic keratosis: Secondary | ICD-10-CM | POA: Diagnosis not present

## 2024-07-07 DIAGNOSIS — L821 Other seborrheic keratosis: Secondary | ICD-10-CM | POA: Diagnosis not present

## 2024-07-07 DIAGNOSIS — D485 Neoplasm of uncertain behavior of skin: Secondary | ICD-10-CM | POA: Diagnosis not present

## 2024-08-05 DIAGNOSIS — L905 Scar conditions and fibrosis of skin: Secondary | ICD-10-CM | POA: Diagnosis not present

## 2024-08-05 DIAGNOSIS — C44612 Basal cell carcinoma of skin of right upper limb, including shoulder: Secondary | ICD-10-CM | POA: Diagnosis not present

## 2024-08-25 DIAGNOSIS — M25512 Pain in left shoulder: Secondary | ICD-10-CM | POA: Diagnosis not present

## 2024-08-25 DIAGNOSIS — M25511 Pain in right shoulder: Secondary | ICD-10-CM | POA: Diagnosis not present

## 2024-09-11 DIAGNOSIS — M25511 Pain in right shoulder: Secondary | ICD-10-CM | POA: Diagnosis not present

## 2024-09-24 DIAGNOSIS — M25512 Pain in left shoulder: Secondary | ICD-10-CM | POA: Diagnosis not present

## 2024-09-24 DIAGNOSIS — M25511 Pain in right shoulder: Secondary | ICD-10-CM | POA: Diagnosis not present

## 2024-10-14 ENCOUNTER — Emergency Department (HOSPITAL_COMMUNITY)

## 2024-10-14 ENCOUNTER — Encounter (HOSPITAL_COMMUNITY): Payer: Self-pay | Admitting: Emergency Medicine

## 2024-10-14 ENCOUNTER — Other Ambulatory Visit: Payer: Self-pay

## 2024-10-14 ENCOUNTER — Emergency Department (HOSPITAL_COMMUNITY)
Admission: EM | Admit: 2024-10-14 | Discharge: 2024-10-15 | Disposition: A | Discharge status: Home / Self Care | Attending: Emergency Medicine | Admitting: Emergency Medicine

## 2024-10-14 DIAGNOSIS — Z981 Arthrodesis status: Secondary | ICD-10-CM | POA: Diagnosis not present

## 2024-10-14 DIAGNOSIS — R002 Palpitations: Secondary | ICD-10-CM | POA: Diagnosis not present

## 2024-10-14 DIAGNOSIS — R0602 Shortness of breath: Secondary | ICD-10-CM | POA: Diagnosis not present

## 2024-10-14 DIAGNOSIS — R0789 Other chest pain: Secondary | ICD-10-CM | POA: Diagnosis not present

## 2024-10-14 DIAGNOSIS — R079 Chest pain, unspecified: Secondary | ICD-10-CM | POA: Insufficient documentation

## 2024-10-14 LAB — BASIC METABOLIC PANEL WITH GFR
Anion gap: 10 (ref 5–15)
BUN: 17 mg/dL (ref 6–20)
CO2: 25 mmol/L (ref 22–32)
Calcium: 9.3 mg/dL (ref 8.9–10.3)
Chloride: 104 mmol/L (ref 98–111)
Creatinine, Ser: 1.24 mg/dL (ref 0.61–1.24)
GFR, Estimated: >60 mL/min (ref >60)
Glucose, Bld: 127 mg/dL — ABNORMAL HIGH (ref 70–99)
Potassium: 3.8 mmol/L (ref 3.5–5.1)
Sodium: 139 mmol/L (ref 135–145)

## 2024-10-14 LAB — TROPONIN I (HIGH SENSITIVITY)
Troponin I (High Sensitivity): 2 ng/L (ref <18)
Troponin I (High Sensitivity): 3 ng/L (ref <18)

## 2024-10-14 LAB — CBC
HCT: 48.6 % (ref 39.0–52.0)
Hemoglobin: 16.5 g/dL (ref 13.0–17.0)
MCH: 31.1 pg (ref 26.0–34.0)
MCHC: 34 g/dL (ref 30.0–36.0)
MCV: 91.5 fL (ref 80.0–100.0)
Platelets: 194 K/uL (ref 150–400)
RBC: 5.31 MIL/uL (ref 4.22–5.81)
RDW: 11.8 % (ref 11.5–15.5)
WBC: 9.8 K/uL (ref 4.0–10.5)
nRBC: 0 % (ref 0.0–0.2)

## 2024-10-14 MED ORDER — SODIUM CHLORIDE 0.9 % IV BOLUS
1000.0000 mL | Freq: Once | INTRAVENOUS | Status: AC
  Administered 2024-10-15: 1000 mL via INTRAVENOUS

## 2024-10-14 MED ORDER — ALUM & MAG HYDROXIDE-SIMETH 200-200-20 MG/5ML PO SUSP
30.0000 mL | Freq: Once | ORAL | Status: AC
  Administered 2024-10-15: 30 mL via ORAL
  Filled 2024-10-14: qty 30

## 2024-10-14 NOTE — ED Provider Notes (Incomplete)
 [place] EMERGENCY DEPARTMENT AT [facility] Provider Note   CSN: [id number] Arrival date & time: 10/14/24  1709     Patient presents with: Chest Pain and Shortness of Breath   George Lawrence is a 58 y.o. male with history of crepitations, dysrhythmia, history of A-fib in 2002.  Presents to ED complaining of chest pain, shortness of breath.  Reports that he has had on and off chest pain for the last 2 weeks.  He is unsure of alleviating or aggravating factors.  Reports history of atrial fibrillation in 2002.  Reports he saw cardiologist who prescribed him a medication which he took for 90 days.  States that on follow-up visit, his A-fib had resolved and he has not had any further cardiac appointment since this time.  Reports that over the last 2 weeks he has had on and off chest pain.  He reports that tonight he developed shortness of breath which is what prompted him to come to the ED.  He denies any kind of leg swelling, nausea, vomiting or diarrhea.  Denies abdominal pain.  Denies any weakness but states that today he stood up and developed dizziness that lasted for 20 seconds.   Chest Pain Associated symptoms: shortness of breath   Shortness of Breath Associated symptoms: chest pain        Prior to Admission medications   Medication Sig Start Date End Date Taking? Authorizing Provider  gabapentin (NEURONTIN) 300 MG capsule Take 300 mg by mouth at bedtime.    [provider]  ondansetron  (ZOFRAN ) 4 MG tablet Take 1 tablet (4 mg total) by mouth every 8 (eight) hours as needed for nausea or vomiting. 06/04/19   Burnetta Aures, MD    Allergies: Oxycodone    Review of Systems  Respiratory:  Positive for shortness of breath.   Cardiovascular:  Positive for chest pain.    Updated Vital Signs BP 123/89 (BP Location: Left Arm)   Pulse 89   Temp 97.6 F (36.4 C)   Resp 17   Ht 6' 4.5 (1.943 m)   Wt 109 kg   SpO2 95%   BMI 28.87 kg/m   Physical Exam  (all  labs ordered are listed, but only abnormal results are displayed) Labs Reviewed  BASIC METABOLIC PANEL WITH GFR - Abnormal; Notable for the following components:      Result Value   Glucose, Bld 127 (*)    All other components within normal limits  CBC  D-DIMER, QUANTITATIVE  TROPONIN I (HIGH SENSITIVITY)  TROPONIN I (HIGH SENSITIVITY)    EKG: None  Radiology: DG Chest 1 View Result Date: 10/14/2024 EXAM: 1 VIEW XRAY OF THE CHEST 10/14/2024 05:49:00 PM COMPARISON: None available. CLINICAL HISTORY: Chest pain FINDINGS: LUNGS AND PLEURA: No focal pulmonary opacity. No pleural effusion. No pneumothorax. HEART AND MEDIASTINUM: No acute abnormality of the cardiac and mediastinal silhouettes. BONES AND SOFT TISSUES: C7-T1 ACDF hardware in place. IMPRESSION: 1. No acute cardiopulmonary process. Electronically signed by: Norleen Boxer MD 10/14/2024 06:19 PM EST RP Workstation: [id number]    {Document cardiac monitor, telemetry assessment procedure when appropriate:32947} Procedures   Medications Ordered in the ED - No data to display    {Click here for ABCD2, HEART and other calculators REFRESH Note before signing:1}                              Medical Decision Making Amount and/or Complexity of Data  Reviewed Labs: ordered.   ***  {Document critical care time when appropriate  Document review of labs and clinical decision tools ie CHADS2VASC2, etc  Document your independent review of radiology images and any outside records  Document your discussion with family members, caretakers and with consultants  Document social determinants of health affecting pt's care  Document your decision making why or why not admission, treatments were needed:32947:::1}   Final diagnoses:  None    ED Discharge Orders     None

## 2024-10-14 NOTE — ED Triage Notes (Signed)
 Pt reports CP and SOB intermittently with HTN. Denies pain at this time.

## 2024-10-14 NOTE — ED Provider Triage Note (Signed)
 Emergency Medicine Provider Triage Evaluation Note  George Lawrence , a 58 y.o. male  was evaluated in triage.  Pt complains of CP on/off x 2 weeks.  History of A-fib years ago that resolved on its own  Review of Systems  Positive: Dizziness with standing, chest pain, chest tightness Negative: Fever, chills, nausea, vomiting  Physical Exam  BP (!) 143/106 (BP Location: Right Arm)   Pulse (!) 110   Temp 97.7 F (36.5 C)   Resp 17   SpO2 99%  Gen:   Awake, no distress   Resp:  Normal effort  MSK:   Moves extremities without difficulty  Other:    Medical Decision Making  Medically screening exam initiated at 5:21 PM.  Appropriate orders placed.  George Lawrence was informed that the remainder of the evaluation will be completed by another provider, this initial triage assessment does not replace that evaluation, and the importance of remaining in the ED until their evaluation is complete.  Labs and imaging ordered   George Ileana SAILOR, George Lawrence 10/14/24 1723

## 2024-10-14 NOTE — ED Triage Notes (Signed)
 Patient arrives ambulatory by POV c/o mid chest pain and shortness of breath onset of an hour ago. Patient states it is painful to take a deep breath. Reports episode of atrial fib years ago that resolved on its own.

## 2024-10-14 NOTE — ED Provider Notes (Signed)
 [place] EMERGENCY DEPARTMENT AT [facility] Provider Note   CSN: [id number] Arrival date & time: 10/14/24  1709     Patient presents with: Chest Pain and Shortness of Breath   George Lawrence is a 58 y.o. male with history of crepitations, dysrhythmia, history of A-fib in 2002.  Presents to ED complaining of chest pain, shortness of breath.  Reports that he has had on and off chest pain for the last 2 weeks.  He is unsure of alleviating or aggravating factors.  Reports history of atrial fibrillation in 2002.  Reports he saw cardiologist who prescribed him a medication which he took for 90 days.  States that on follow-up visit, his A-fib had resolved and he has not had any further cardiac appointment since this time.  Reports that over the last 2 weeks he has had on and off chest pain.  He reports that tonight he developed shortness of breath which is what prompted him to come to the ED.  He denies any kind of leg swelling, nausea, vomiting or diarrhea.  Denies abdominal pain.  Denies any weakness but states that today he stood up and developed dizziness that lasted for 20 seconds.   Chest Pain Associated symptoms: shortness of breath   Shortness of Breath Associated symptoms: chest pain        Prior to Admission medications   Medication Sig Start Date End Date Taking? Authorizing Provider  gabapentin (NEURONTIN) 300 MG capsule Take 300 mg by mouth at bedtime.    [provider]  ondansetron  (ZOFRAN ) 4 MG tablet Take 1 tablet (4 mg total) by mouth every 8 (eight) hours as needed for nausea or vomiting. 06/04/19   Burnetta Aures, MD    Allergies: Oxycodone    Review of Systems  Respiratory:  Positive for shortness of breath.   Cardiovascular:  Positive for chest pain.  All other systems reviewed and are negative.   Updated Vital Signs BP 123/83   Pulse 86   Temp 97.6 F (36.4 C)   Resp 16   Ht 6' 4.5 (1.943 m)   Wt 109 kg   SpO2 96%   BMI 28.87 kg/m    Physical Exam Vitals and nursing note reviewed.  Constitutional:      General: He is not in acute distress.    Appearance: He is well-developed.  HENT:     Head: Normocephalic and atraumatic.  Eyes:     Conjunctiva/sclera: Conjunctivae normal.  Cardiovascular:     Rate and Rhythm: Normal rate and regular rhythm.     Heart sounds: No murmur heard. Pulmonary:     Effort: Pulmonary effort is normal. No respiratory distress.     Breath sounds: Normal breath sounds.  Abdominal:     Palpations: Abdomen is soft.     Tenderness: There is no abdominal tenderness.  Musculoskeletal:        General: No swelling.     Cervical back: Neck supple.  Skin:    General: Skin is warm and dry.     Capillary Refill: Capillary refill takes less than 2 seconds.  Neurological:     General: No focal deficit present.     Mental Status: He is alert and oriented to person, place, and time. Mental status is at baseline.     GCS: GCS eye subscore is 4. GCS verbal subscore is 5. GCS motor subscore is 6.     Cranial Nerves: Cranial nerves 2-12 are intact. No cranial nerve deficit.  Sensory: Sensation is intact. No sensory deficit.     Motor: Motor function is intact. No weakness.     Coordination: Coordination is intact. Heel to [facility] Test normal.     Comments: Reassuring neurological examination without focal neurodeficits.  Psychiatric:        Mood and Affect: Mood normal.     (all labs ordered are listed, but only abnormal results are displayed) Labs Reviewed  BASIC METABOLIC PANEL WITH GFR - Abnormal; Notable for the following components:      Result Value   Glucose, Bld 127 (*)    All other components within normal limits  CBC  D-DIMER, QUANTITATIVE  TROPONIN I (HIGH SENSITIVITY)  TROPONIN I (HIGH SENSITIVITY)    EKG: None  Radiology: DG Chest 1 View Result Date: 10/14/2024 EXAM: 1 VIEW XRAY OF THE CHEST 10/14/2024 05:49:00 PM COMPARISON: None available. CLINICAL HISTORY: Chest pain  FINDINGS: LUNGS AND PLEURA: No focal pulmonary opacity. No pleural effusion. No pneumothorax. HEART AND MEDIASTINUM: No acute abnormality of the cardiac and mediastinal silhouettes. BONES AND SOFT TISSUES: C7-T1 ACDF hardware in place. IMPRESSION: 1. No acute cardiopulmonary process. Electronically signed by: Norleen Boxer MD 10/14/2024 06:19 PM EST RP Workstation: [id number]    Procedures   Medications Ordered in the ED  sodium chloride  0.9 % bolus 1,000 mL (1,000 mLs Intravenous New Bag/Given 10/15/24 0042)  alum & mag hydroxide-simeth (MAALOX/MYLANTA) 200-200-20 MG/5ML suspension 30 mL (30 mLs Oral Given 10/15/24 0042)      Medical Decision Making Amount and/or Complexity of Data Reviewed Labs: ordered.   58 year old male presents for evaluation.  Please see HPI for further details.  On examination the patient is afebrile and nontachycardic.  Patient did arrive tachycardic to 110 but this normalized without intervention. Lung sounds clear bilaterally, no hypoxia.  Abdomen soft and compressible.  Neurological examination at baseline without focal neurodeficits.  Patient ambulates steady gait.  No edema to bilateral lower extremities.  Overall patient nontoxic in appearance with reassuring vital signs.  Assessed utilizing CBC, BMP, troponin x 2, EKG, chest x-ray, D-dimer.  Patient given liter of fluid for tachycardia.  CBC without leukocytosis or anemia.  Metabolic panel is grossly unremarkable without electrolyte derangement.  Stable creatinine.  D-dimer negative so doubt PE.  Troponin 2, delta 3.  EKG is nonischemic and chest x-ray is unremarkable.  Patient on reevaluation reports he feels fine.  Denies any symptoms.  His tachycardia has normalized.  Blood pressures currently 120 bradycardia.  At this time patient will be discharged home, will be sent with ambulatory referral to cardiology.  Have given him strict return precautions and he voiced understanding.  Stable to discharge  home.    Final diagnoses:  Chest pain, unspecified type  Shortness of breath  Heart palpitations    ED Discharge Orders          Ordered    Ambulatory referral to Cardiology       Comments: If you have not heard from the Cardiology office within the next 72 hours please call [phone].   10/15/24 0144               Ruthell Lonni FALCON, PA-C 10/15/24 0145    Haze Lonni PARAS, MD 10/15/24 [phone]

## 2024-10-15 ENCOUNTER — Ambulatory Visit: Attending: General Practice | Admitting: General Practice

## 2024-10-15 ENCOUNTER — Ambulatory Visit

## 2024-10-15 ENCOUNTER — Encounter: Payer: Self-pay | Admitting: General Practice

## 2024-10-15 VITALS — BP 120/86 | HR 96 | Ht 75.0 in | Wt 252.4 lb

## 2024-10-15 DIAGNOSIS — R072 Precordial pain: Secondary | ICD-10-CM

## 2024-10-15 DIAGNOSIS — R000 Tachycardia, unspecified: Secondary | ICD-10-CM

## 2024-10-15 DIAGNOSIS — R079 Chest pain, unspecified: Secondary | ICD-10-CM | POA: Diagnosis not present

## 2024-10-15 DIAGNOSIS — Z8679 Personal history of other diseases of the circulatory system: Secondary | ICD-10-CM

## 2024-10-15 LAB — D-DIMER, QUANTITATIVE: D-Dimer, Quant: <0.27 ug{FEU}/mL (ref 0.00–0.50)

## 2024-10-15 MED ORDER — METOPROLOL TARTRATE 100 MG PO TABS: 100.0000 mg | ORAL_TABLET | Freq: Once | ORAL | 0 refills | Status: DC

## 2024-10-15 NOTE — Progress Notes (Signed)
 Cardiology Clinic Note   Patient Name: George Lawrence Date of Encounter: 10/15/2024  Primary Care Provider:  Tish Elsie FALCON, MD Primary Cardiologist:  None  Patient Profile    George Lawrence 58 year old male presents to the clinic today for evaluation of his chest pain shortness of breath and palpitations.  Past Medical History    Past Medical History:  Diagnosis Date   Complication of anesthesia    Dysrhythmia    2 episodes afib. Last 2016. Resolved spontaneously. No recurrence.    Heart palpitations    PONV (postoperative nausea and vomiting)    Swollen lymph nodes    AGE 57 HAD CAT SCARTCH FEVER   Past Surgical History:  Procedure Laterality Date   ANTERIOR CERVICAL DECOMP/DISCECTOMY FUSION N/A 06/04/2019   Procedure: ANTERIOR CERVICAL DECOMPRESSION/DISCECTOMY FUSION CERVICAL SIX-CERVICAL SEVEN;  Surgeon: Burnetta Aures, MD;  Location: [facility] OR;  Service: Orthopedics;  Laterality: N/A;   KNEE ARTHROSCOPY W/ MENISCAL REPAIR Bilateral    MENISCUS TEAR   LYMPH GLAND EXCISION     WISDOM TOOTH EXTRACTION      Allergies  Allergies  Allergen Reactions   Hydrocodone  Hives and Nausea And Vomiting   Oxycodone Hives    History of Present Illness    George Lawrence has a PMH of swollen lymph nodes, noncompliant behavior, heart palpitations, shortness of breath, and chest pain.  He was diagnosed with atrial fibrillation in 2008.  He presented to the emergency department on 10/14/2024.  He reported chest pain and shortness of breath.  He noted that his pain had been on and off for about 2 weeks.  He was not sure of alleviating or exacerbating factors.  He reported his history of atrial fibrillation.  He had been seen by cardiology and took medication for about 90 days.  During his follow-up visit his A-fib had resolved and he had not pursued any further cardiac visits.  He noted that over the prior 2 weeks he had on and off chest pain.  He also noted shortness of breath which prompted  him to present to the emergency department.  He denied lower extremity swelling, nausea, vomiting, and diarrhea.  He denied abdominal pain.  He noted that he had a episode of dizziness that lasted for about 20 seconds earlier that day.  His EKG showed sinus tachycardia 109 bpm.  His D-dimer was negative.  His high-sensitivity troponins were 2 and 3.  His CBC was unremarkable.  His BMP showed glucose of 127.  His renal function and all other electrolytes were within normal limits.  On reevaluation he reported that he was feeling fine.  He denied symptoms.  His tachycardia normalized.  His blood pressure was 120 systolic.  He was instructed to follow-up with cardiology.  He presents to the clinic today for evaluation and to reestablish care.  He states he noticed a brief flutter this morning when waking up.  It dissipated quickly.  We reviewed his chest discomfort and emergency department visit.  He expressed understanding.  He denies further episodes of atrial fibrillation.  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  We reviewed his recent lab work.  He denies exertional chest discomfort but does note that he is not very physically active due to arthritis.  I will order 7-day cardiac event monitor, magnesium, echocardiogram, and coronary CTA.  Will plan follow-up after testing.  Today he denies  shortness  of breath, lower extremity edema, fatigue, palpitations, melena, hematuria, hemoptysis, diaphoresis, weakness, presyncope, syncope, orthopnea, and PND.   Home Medications    Prior to Admission medications   Medication Sig Start Date End Date Taking? Authorizing Provider  gabapentin (NEURONTIN) 300 MG capsule Take 300 mg by mouth at bedtime.    [provider]  ondansetron  (ZOFRAN ) 4 MG tablet Take 1 tablet (4 mg total) by mouth every 8 (eight) hours as needed for nausea or vomiting. 06/04/19   Burnetta Aures, MD     Family History    Family History  Problem Relation Age of Onset   Cancer Mother 50       PANCREATIC CANCER   Heart block Father        STENTS X2   Heart attack Maternal Uncle    Hypertension Neg Hx    He indicated that his mother is alive. He indicated that his father is alive. He indicated that his sister is alive. He indicated that his brother is alive. He indicated that his maternal grandmother is deceased. He indicated that his maternal grandfather is deceased. He indicated that his paternal grandmother is deceased. He indicated that his paternal grandfather is deceased. He indicated that both of his sons are alive. He indicated that the status of his maternal uncle is unknown. He indicated that the status of his neg hx is unknown.  Social History    Social History   Socioeconomic History   Marital status: Married    Spouse name: Not on file   Number of children: Not on file   Years of education: Not on file   Highest education level: Not on file  Occupational History   Not on file  Tobacco Use   Smoking status: Never   Smokeless tobacco: Current    Types: Chew  Vaping Use   Vaping status: Never Used  Substance and Sexual Activity   Alcohol use: Yes    Comment: occasionally 8/week    Drug use: No   Sexual activity: Not on file  Other Topics Concern   Not on file  Social History Narrative   Not on file   Social Drivers of Health   Financial Resource Strain: Not on file  Food Insecurity: Not on file  Transportation Needs: Not on file  Physical Activity: Not on file  Stress: Not on file  Social Connections: Unknown (04/08/2022)   Received from [facility]   Social Network    Social Network: Not on file  Intimate Partner Violence: Unknown (02/28/2022)   Received from [facility]   HITS    Physically Hurt: Not on file    Insult or Talk Down To: Not on file    Threaten Physical Harm: Not on file    Scream or Curse: Not on file     Review of Systems     General:  No chills, fever, night sweats or weight changes.  Cardiovascular:  No chest pain, dyspnea on exertion, edema, orthopnea, palpitations, paroxysmal nocturnal dyspnea. Dermatological: No rash, lesions/masses Respiratory: No cough, dyspnea Urologic: No hematuria, dysuria Abdominal:   No nausea, vomiting, diarrhea, bright red blood per rectum, melena, or hematemesis Neurologic:  No visual changes, wkns, changes in mental status. All other systems reviewed and are otherwise negative except as noted above.  Physical Exam    VS:  BP 120/86   Pulse 96   Ht 6' 3 (1.905 m)   Wt 252 lb 6.4 oz (114.5 kg)   SpO2 98%  BMI 31.55 kg/m  , BMI Body mass index is 31.55 kg/m. GEN: Well nourished, well developed, in no acute distress. HEENT: normal. Neck: Supple, no JVD, carotid bruits, or masses. Cardiac: RRR, no murmurs, rubs, or gallops. No clubbing, cyanosis, edema.  Radials/DP/PT 2+ and equal bilaterally.  Respiratory:  Respirations regular and unlabored, clear to auscultation bilaterally. GI: Soft, nontender, nondistended, BS + x 4. MS: no deformity or atrophy. Skin: warm and dry, no rash. Neuro:  Strength and sensation are intact. Psych: Normal affect.  Accessory Clinical Findings    Recent Labs: 10/14/2024: BUN 17; Creatinine, Ser 1.24; Hemoglobin 16.5; Platelets 194; Potassium 3.8; Sodium 139   Recent Lipid Panel No results found for: CHOL, TRIG, HDL, CHOLHDL, VLDL, LDLCALC, LDLDIRECT       ECG personally reviewed by me today- None today.          Assessment & Plan   1.  Chest pain, tachycardia- rate today 96.  No chest pain today.  Presented to the emergency department yesterday and reported ongoing off-and-on chest discomfort for about 2 weeks.  Lab work was unremarkable.  Reports prior history of atrial fibrillation in 2002.  He was noted to have tachycardia with heart rates in the 109 range. Order echocardiogram, coronary CTA Order cardiac  event monitor 7 day Magnesium   History of atrial fibrillation, palpitations-heart rate today 96 .  Previous history of atrial fibrillation in 2002.  Denies reoccurrence.  During that time with follow-up with cardiology A-fib had resolved.  He was lost to follow-up. Avoid triggers caffeine, chocolate, EtOH, dehydration excetra. Heart healthy low-sodium diet Increase physical activity as tolerated  Disposition: Follow-up with Dr. Floretta or me in 2-3 months.   Josefa HERO. Jamin Panther NP-C     10/15/2024, 2:24 PM [facility] [address] [place], [place] [address] Office [phone]    Notice: This dictation was prepared with Dragon dictation along with smaller phrase technology. Any transcriptional errors that result from this process are unintentional and may not be corrected upon review.   I spent 14 minutes examining this patient, reviewing medications, and using patient centered shared decision making involving their cardiac care.   I spent  20 minutes reviewing past medical history,  medications, and prior cardiac tests.

## 2024-10-15 NOTE — Discharge Instructions (Addendum)
 It was a pleasure taking part in your care.  As we discussed, your workup here was reassuring.  I am referring you to cardiology for further management.  They will be calling you in the next 5 days to schedule an appointment.  If they do not call you, please contact them with the number on this form.  Return to the ED with new symptoms.

## 2024-10-15 NOTE — Progress Notes (Unsigned)
 Enrolled patient for a 7 day Zio XT monitor to be mailed to patients home.

## 2024-10-15 NOTE — Patient Instructions (Signed)
 Medication Instructions:  Your physician recommends that you continue on your current medications as directed. Please refer to the Current Medication list given to you today.  *If you need a refill on your cardiac medications before your next appointment, please call your pharmacy*  Lab Work: TODAY:  MAGNESIUM LEVEL If you have labs (blood work) drawn today and your tests are completely normal, you will receive your results only by: MyChart Message (if you have MyChart) OR A paper copy in the mail If you have any lab test that is abnormal or we need to change your treatment, we will call you to review the results.  Testing/Procedures: Your physician has requested that you have an echocardiogram. Echocardiography is a painless test that uses sound waves to create images of your heart. It provides your doctor with information about the size and shape of your heart and how well your heart's chambers and valves are working. This procedure takes approximately one hour. There are no restrictions for this procedure. Please do NOT wear cologne, perfume, aftershave, or lotions (deodorant is allowed). Please arrive 15 minutes prior to your appointment time.  Please note: We ask at that you not bring children with you during ultrasound (echo/ vascular) testing. Due to room size and safety concerns, children are not allowed in the ultrasound rooms during exams. Our front office staff cannot provide observation of children in our lobby area while testing is being conducted. An adult accompanying a patient to their appointment will only be allowed in the ultrasound room at the discretion of the ultrasound technician under special circumstances. We apologize for any inconvenience.   Your physician has requested that you have cardiac CT. Cardiac computed tomography (CT) is a painless test that uses an x-ray machine to take clear, detailed pictures of your heart. For further information please visit  [email or web address] Please follow instruction sheet as BELOW:    Your cardiac CT will be scheduled at one of the below locations:   [facility] [address] [place], [place] [address] [phone] (Severe contrast allergies only)  OR   [facility] [address] [place], [place] [address] [phone]  OR   [facility] [address] [place], [place] [address] [phone]  OR   [facility] V[facility] Tower [address]  [place], [place] [address]  OR   [facility] [address] [place], [place] [phone]  If scheduled at [facility], please arrive at the [facility] and Children's Entrance (Entrance C2) of [facility] 30 minutes prior to test start time. You can use the FREE valet parking offered at entrance C (encouraged to control the heart rate for the test)  Proceed to the [facility] Radiology Department (first floor) to check-in and test prep.  All radiology patients and guests should use entrance C2 at [facility], accessed from [facility], even though the hospital's physical address listed is [address]  If scheduled at the Heart and Vascular Tower at [address], please enter the parking lot using the [address] entrance and use the FREE valet service at the patient drop-off area. Enter the building and check-in with registration on the main floor.  If scheduled at [facility], please arrive to the Heart and Vascular Center 15 mins early for check-in and test prep.  There is spacious parking and easy access to the radiology department from the [facility] Heart and Vascular  entrance. Please enter here and check-in with the desk attendant.   If scheduled at [facility], please arrive 30 minutes early for check-in and test prep.  Please follow these instructions carefully (unless otherwise  directed):  An IV will be required for this test and Nitroglycerin will be given.  Hold all erectile dysfunction medications at least 3 days (72 hrs) prior to test. (Ie viagra, cialis, sildenafil, tadalafil, etc)   On the Night Before the Test: Be sure to Drink plenty of water. Do not consume any caffeinated/decaffeinated beverages or chocolate 12 hours prior to your test. Do not take any antihistamines 12 hours prior to your test.  If the patient has contrast allergy: Patient will need a prescription for Prednisone and very clear instructions (as follows): Prednisone 50 mg - take 13 hours prior to test Take another Prednisone 50 mg 7 hours prior to test Take another Prednisone 50 mg 1 hour prior to test Take Benadryl 50 mg 1 hour prior to test Patient must complete all four doses of above prophylactic medications. Patient will need a ride after test due to Benadryl.  On the Day of the Test: Drink plenty of water until 1 hour prior to the test. Do not eat any food 1 hour prior to test. You may take your regular medications prior to the test.  Take metoprolol (Lopressor) 100 MG two hours prior to test.  [facility]  After the Test: Drink plenty of water. After receiving IV contrast, you may experience a mild flushed feeling. This is normal. On occasion, you may experience a mild rash up to 24 hours after the test. This is not dangerous. If this occurs, you can take Benadryl 25 mg, Zyrtec, Claritin, or Allegra and increase your fluid intake. (Patients taking Tikosyn should avoid Benadryl, and may take Zyrtec, Claritin, or Allegra) If you experience trouble breathing, this can be serious. If it is severe call 911 IMMEDIATELY. If it is mild, please call our office.  We will call to schedule your test 2-4 weeks out understanding that some insurance companies will need an authorization prior to the service being performed.   For more information and frequently asked  questions, please visit our website : [email or web address]  For non-scheduling related questions, please contact the cardiac imaging nurse navigator should you have any questions/concerns: Cardiac Imaging Nurse Navigators Direct Office Dial: [phone]   For scheduling needs, including cancellations and rescheduling, please call Brittany, [phone]   GEOFFRY HEWS- Long Term Monitor Instructions  Your physician has requested you wear a ZIO patch monitor for 7 days.  This is a single patch monitor. [employer] supplies one patch monitor per enrollment. Additional stickers are not available. Please do not apply patch if you will be having a Nuclear Stress Test,  Echocardiogram, Cardiac CT, MRI, or Chest Xray during the period you would be wearing the  monitor. The patch cannot be worn during these tests. You cannot remove and re-apply the  ZIO XT patch monitor.  Your ZIO patch monitor will be mailed 3 day USPS to your address on file. It may take 3-5 days  to receive your monitor after you have been enrolled.  Once you have received your monitor, please review the enclosed instructions. Your monitor  has already been registered assigning a specific monitor serial # to you.  Billing and Patient Assistance Program Information  We have supplied [employer] with any of your insurance information on file for billing purposes. [employer] offers a  sliding scale Patient Assistance Program for patients that do not have  insurance, or whose insurance does not completely cover the cost of the ZIO monitor.  You must apply for the Patient Assistance Program to qualify for this discounted rate.  To apply, please call [employer] at [phone], select option 4, select option 2, ask to apply for  Patient Assistance Program. Meredeth will ask your household income, and how many people  are in your household. They will quote your out-of-pocket cost based on that information.  [employer] will also be able to set  up a 5-month, interest-free payment plan if needed.  Applying the monitor   Shave hair from upper left chest.  Hold abrader disc by orange tab. Rub abrader in 40 strokes over the upper left chest as  indicated in your monitor instructions.  Clean area with 4 enclosed alcohol pads. Let dry.  Apply patch as indicated in monitor instructions. Patch will be placed under collarbone on left  side of chest with arrow pointing upward.  Rub patch adhesive wings for 2 minutes. Remove white label marked 1. Remove the white  label marked 2. Rub patch adhesive wings for 2 additional minutes.  While looking in a mirror, press and release button in center of patch. A small green light will  flash 3-4 times. This will be your only indicator that the monitor has been turned on.  Do not shower for the first 24 hours. You may shower after the first 24 hours.  Press the button if you feel a symptom. You will hear a small click. Record Date, Time and  Symptom in the Patient Logbook.  When you are ready to remove the patch, follow instructions on the last 2 pages of Patient  Logbook. Stick patch monitor onto the last page of Patient Logbook.  Place Patient Logbook in the blue and white box. Use locking tab on box and tape box closed  securely. The blue and white box has prepaid postage on it. Please place it in the mailbox as  soon as possible. Your physician should have your test results approximately 7 days after the  monitor has been mailed back to [facility].  Call [facility] Customer Care at [phone] if you have questions regarding  your ZIO XT patch monitor. Call them immediately if you see an orange light blinking on your  monitor.  If your monitor falls off in less than 4 days, contact our Monitor department at [phone].  If your monitor becomes loose or falls off after 4 days call [employer] at [phone] for  suggestions on securing your monitor  Follow-Up: At [facility], you and your health needs are our priority.  As part of our continuing mission to provide you with exceptional heart care, our providers are all part of one team.  This team includes your primary Cardiologist (physician) and Advanced Practice Providers or APPs (Physician Assistants and Nurse Practitioners) who all work together to provide you with the care you need, when you need it.  Your next appointment:   2 month(s)  Provider:   Josefa Beauvais, NP          We recommend signing up for the patient portal called MyChart.  Sign up information is provided on this After Visit Summary.  MyChart is used to connect with patients for Virtual Visits (Telemedicine).  Patients are able to view lab/test results, encounter notes, upcoming appointments, etc.  Non-urgent messages can be sent to your provider as well.  To learn more about what you can do with MyChart, go to [email or web address].   Other Instructions

## 2024-10-16 ENCOUNTER — Ambulatory Visit: Payer: Self-pay | Admitting: General Practice

## 2024-10-16 LAB — MAGNESIUM: Magnesium: 2.1 mg/dL (ref 1.6–2.3)

## 2024-10-20 DIAGNOSIS — R079 Chest pain, unspecified: Secondary | ICD-10-CM | POA: Diagnosis not present

## 2024-10-28 ENCOUNTER — Telehealth: Payer: Self-pay | Admitting: General Practice

## 2024-10-28 DIAGNOSIS — Z8679 Personal history of other diseases of the circulatory system: Secondary | ICD-10-CM

## 2024-10-28 DIAGNOSIS — R072 Precordial pain: Secondary | ICD-10-CM

## 2024-10-28 DIAGNOSIS — R079 Chest pain, unspecified: Secondary | ICD-10-CM

## 2024-10-28 DIAGNOSIS — R000 Tachycardia, unspecified: Secondary | ICD-10-CM

## 2024-10-28 MED ORDER — METOPROLOL TARTRATE 100 MG PO TABS: 100.0000 mg | ORAL_TABLET | Freq: Once | ORAL | 0 refills | Status: AC

## 2024-10-28 NOTE — Telephone Encounter (Signed)
*  STAT* If patient is at the pharmacy, call can be transferred to refill team.   1. Which medications need to be refilled? (please list name of each medication and dose if known)  metoprolol  tartrate (LOPRESSOR ) 100 MG tablet  2. Which pharmacy/location (including street and city if local pharmacy) is medication to be sent to? [id number] [place] [place] - [address]    3. Do they need a 30 day or 90 day supply?  1 tablet to take prior to scan. Looks like previous order may have expired. [facility] that [facility] not [facility].

## 2024-10-28 NOTE — Telephone Encounter (Signed)
 Requested Prescriptions   Signed Prescriptions Disp Refills   metoprolol  tartrate (LOPRESSOR ) 100 MG tablet 1 tablet 0    Sig: Take 1 tablet (100 mg total) by mouth once for 1 dose. Take 90-120 minutes prior to scan. Hold for SBP less than 110.    Authorizing Provider: EMELIA JOSEFA HERO    Ordering User: WILFRED, Madalene Mickler  C   Refill has been forwarded to pt's local pharmacy for one dose only.

## 2024-10-30 ENCOUNTER — Encounter (HOSPITAL_COMMUNITY): Payer: Self-pay

## 2024-11-03 ENCOUNTER — Ambulatory Visit (HOSPITAL_COMMUNITY)
Admission: RE | Admit: 2024-11-03 | Discharge: 2024-11-03 | Disposition: A | Source: Ambulatory Visit | Attending: General Practice

## 2024-11-03 DIAGNOSIS — R072 Precordial pain: Secondary | ICD-10-CM

## 2024-11-03 DIAGNOSIS — R000 Tachycardia, unspecified: Secondary | ICD-10-CM

## 2024-11-03 DIAGNOSIS — R079 Chest pain, unspecified: Secondary | ICD-10-CM

## 2024-11-03 DIAGNOSIS — Z8679 Personal history of other diseases of the circulatory system: Secondary | ICD-10-CM

## 2024-11-03 MED ORDER — NITROGLYCERIN 0.4 MG SL SUBL
0.8000 mg | SUBLINGUAL_TABLET | Freq: Once | SUBLINGUAL | Status: AC
  Administered 2024-11-03: 0.8 mg via SUBLINGUAL

## 2024-11-03 MED ORDER — IOHEXOL 350 MG/ML SOLN
100.0000 mL | Freq: Once | INTRAVENOUS | Status: AC | PRN
  Administered 2024-11-03: 100 mL via INTRAVENOUS

## 2024-11-04 DIAGNOSIS — R079 Chest pain, unspecified: Secondary | ICD-10-CM | POA: Diagnosis not present

## 2024-11-05 DIAGNOSIS — M25512 Pain in left shoulder: Secondary | ICD-10-CM | POA: Diagnosis not present

## 2024-11-05 DIAGNOSIS — M25511 Pain in right shoulder: Secondary | ICD-10-CM | POA: Diagnosis not present

## 2024-11-17 ENCOUNTER — Other Ambulatory Visit (HOSPITAL_BASED_OUTPATIENT_CLINIC_OR_DEPARTMENT_OTHER)

## 2024-11-17 DIAGNOSIS — R000 Tachycardia, unspecified: Secondary | ICD-10-CM | POA: Diagnosis not present

## 2024-11-17 DIAGNOSIS — Z8679 Personal history of other diseases of the circulatory system: Secondary | ICD-10-CM | POA: Diagnosis not present

## 2024-11-17 DIAGNOSIS — R072 Precordial pain: Secondary | ICD-10-CM | POA: Diagnosis not present

## 2024-11-17 DIAGNOSIS — R079 Chest pain, unspecified: Secondary | ICD-10-CM

## 2024-11-17 LAB — ECHOCARDIOGRAM COMPLETE
Area-P 1/2: 5.31 cm2
S' Lateral: 2.35 cm

## 2024-11-17 NOTE — Progress Notes (Signed)
 Patient has been notified directly; all questions, if any, were answered. Patient voiced understanding.  Will follow up at scheduled appointment

## 2024-11-26 DIAGNOSIS — Z8679 Personal history of other diseases of the circulatory system: Secondary | ICD-10-CM

## 2024-11-26 DIAGNOSIS — R072 Precordial pain: Secondary | ICD-10-CM

## 2024-11-26 DIAGNOSIS — R079 Chest pain, unspecified: Secondary | ICD-10-CM

## 2024-11-26 DIAGNOSIS — R000 Tachycardia, unspecified: Secondary | ICD-10-CM | POA: Diagnosis not present

## 2024-12-17 NOTE — Progress Notes (Unsigned)
 " Cardiology Office Note   Date: 12/18/2024  ID:  George Lawrence 05/19/1966 [id number] PCP: Rexanne Ingle, MD  [facility] Providers Cardiologist: None     Chief Complaint: George Lawrence is a 59 y.o.male with PMH of PAF who presents to the clinic for two-month follow-up.    [name] has a history of atrial fibrillation dating back to 2008.  The episodes were very rare and he did not regularly follow with cardiology.  He presented to the ER 10/14/2024 with chest pain and dyspnea. EKG did show tachycardia but no acute ischemic changes. Troponin 2>3, D-dimer normal.  He was referred for outpatient cardiology follow-up.  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  He was not very active, so dyspnea and exertional chest pain were difficult to assess. Monitor 10/2024 showed predominantly sinus rhythm with average HR 94 bpm.  Rare PACs and PVCs.  9 triggered events associated with sinus or sinus tachycardia.  Coronary CTA 11/03/2024 showed coronary calcium score 0.  Radiology read did show 5 mm right middle lobe nodule with recommendation for optional repeat CT in 12 months. Echo 11/17/2024 showed LVEF 60-65%, no RWMAs, normal RV, no valvular concerns. It was recommended that the nodule be followed up with his PCP.     History of Present Illness: Today he is doing well. He tells me that when he went to the ER he was concerned because he had constant chest pressure that did not go away for several hours. It was not exertional. He retired early from [employer] in 2020 after an injury to his neck, and he has chronic left arm numbness. He had an episode three days ago where he had pressure in the center of his chest when he was sitting in his chair. He did not have associated palpitations or dyspnea. He does feel that since he had pending test results, he was more cardiac-aware. His coronary CTA, monitor, and  echocardiogram were reviewed and he was relieved with the results. He is not as active as he used to be when he worked for [employer]. He does try to stay well-hydrated. He drinks 2 cups of coffee each morning. His BP readings at home are 100s-120s/80s.  ROS: Denies anginal chest pain, shortness of breath, lower extremity edema, palpitations, lightheadedness, dizziness, syncope.   Studies Reviewed: The following studies were reviewed today: Cardiac Studies & Procedures   ______________________________________________________________________________________________     ECHOCARDIOGRAM  ECHOCARDIOGRAM COMPLETE 11/17/2024  Narrative ECHOCARDIOGRAM REPORT    Patient Name:   George Lawrence Date of Exam: 11/17/2024 Medical Rec #:  [id number]    Height:       75.0 in Accession #:    [id number]   Weight:       252.4 lb Date of Birth:  1966-11-22    BSA:          2.422 m Patient Age:    58 years     BP:           128/78 mmHg Patient Gender: M            HR:           93 bpm. Exam Location:  Outpatient  Procedure: 2D Echo, 3D Echo, Color Doppler, Cardiac Doppler and Strain Analysis (Both Spectral and Color Flow Doppler were utilized during procedure).  Indications:    Chest Pain  History:  Patient has no prior history of Echocardiogram examinations. Signs/Symptoms:Shortness of Breath and Chest Pain; Risk Factors:Non-Smoker. History of AFUB 2016 spontaneously resolved.  Sonographer:    Orvil Holmes RDCS Referring Phys: [id number] JESSE M CLEAVER  IMPRESSIONS   1. Left ventricular ejection fraction, by estimation, is 60 to 65%. Left ventricular ejection fraction by 3D volume is 58 %. The left ventricle has normal function. The left ventricle has no regional wall motion abnormalities. Left ventricular diastolic parameters were normal. The average left ventricular global longitudinal strain is -13.3 %. The global longitudinal strain is abnormal. 2. Right ventricular systolic function is  normal. The right ventricular size is normal. 3. The mitral valve is normal in structure. No evidence of mitral valve regurgitation. No evidence of mitral stenosis. 4. The aortic valve is normal in structure. Aortic valve regurgitation is not visualized. No aortic stenosis is present. 5. The inferior vena cava is normal in size with greater than 50% respiratory variability, suggesting right atrial pressure of 3 mmHg.  Comparison(s): No prior Echocardiogram.  FINDINGS Left Ventricle: Left ventricular ejection fraction, by estimation, is 60 to 65%. Left ventricular ejection fraction by 3D volume is 58 %. The left ventricle has normal function. The left ventricle has no regional wall motion abnormalities. The average left ventricular global longitudinal strain is -13.3 %. Strain was performed and the global longitudinal strain is abnormal. The left ventricular internal cavity size was normal in size. There is no left ventricular hypertrophy. Left ventricular diastolic parameters were normal.  Right Ventricle: The right ventricular size is normal. No increase in right ventricular wall thickness. Right ventricular systolic function is normal.  Left Atrium: Left atrial size was normal in size.  Right Atrium: Right atrial size was normal in size.  Pericardium: There is no evidence of pericardial effusion.  Mitral Valve: The mitral valve is normal in structure. No evidence of mitral valve regurgitation. No evidence of mitral valve stenosis.  Tricuspid Valve: The tricuspid valve is normal in structure. Tricuspid valve regurgitation is not demonstrated. No evidence of tricuspid stenosis.  Aortic Valve: The aortic valve is normal in structure. Aortic valve regurgitation is not visualized. No aortic stenosis is present.  Pulmonic Valve: The pulmonic valve was normal in structure. Pulmonic valve regurgitation is not visualized. No evidence of pulmonic stenosis.  Aorta: The aortic root is normal in  size and structure.  Venous: The inferior vena cava is normal in size with greater than 50% respiratory variability, suggesting right atrial pressure of 3 mmHg.  IAS/Shunts: No atrial level shunt detected by color flow Doppler.  Additional Comments: 3D was performed not requiring image post processing on an independent workstation and was normal.   LEFT VENTRICLE PLAX 2D LVIDd:         3.97 cm         Diastology LVIDs:         2.35 cm         LV e' medial:    7.29 cm/s LV PW:         0.93 cm         LV E/e' medial:  9.3 LV IVS:        0.92 cm         LV e' lateral:   10.10 cm/s LVOT diam:     2.30 cm         LV E/e' lateral: 6.7 LV SV:         60 LV SV Index:   25  2D Longitudinal LVOT Area:     4.15 cm        Strain 2D Strain GLS   -12.3 % (A4C): 2D Strain GLS   -10.7 % (A3C): 2D Strain GLS   -16.9 % (A2C): 2D Strain GLS   -13.3 % Avg:  3D Volume EF LV 3D EF:    Left ventricul ar ejection fraction by 3D volume is 58 %.  3D Volume EF: 3D EF:        58 % LV EDV:       102 ml LV ESV:       42 ml LV SV:        59 ml  RIGHT VENTRICLE RV Basal diam:  3.88 cm     PULMONARY VEINS RV Mid diam:    3.35 cm     A Reversal Velocity: 28.30 cm/s RV S prime:     20.10 cm/s  Diastolic Velocity:  33.80 cm/s TAPSE (M-mode): 3.2 cm      S/D Velocity:        1.40 Systolic Velocity:   46.50 cm/s  LEFT ATRIUM             Index        RIGHT ATRIUM           Index LA diam:        2.80 cm 1.16 cm/m   RA Area:     13.40 cm LA Vol (A2C):   41.3 ml 17.05 ml/m  RA Volume:   35.40 ml  14.61 ml/m LA Vol (A4C):   24.4 ml 10.07 ml/m LA Biplane Vol: 32.9 ml 13.58 ml/m AORTIC VALVE LVOT Vmax:   79.10 cm/s LVOT Vmean:  53.500 cm/s LVOT VTI:    0.144 m  AORTA Ao Root diam: 3.50 cm Ao Asc diam:  3.30 cm  MITRAL VALVE MV Area (PHT): 5.31 cm    SHUNTS MV Decel Time: 143 msec    Systemic VTI:  0.14 m MV E velocity: 67.60 cm/s  Systemic Diam: 2.30 cm MV A velocity:  79.10 cm/s MV E/A ratio:  0.85  Franck Azobou Tonleu Electronically signed by Joelle Cedars Tonleu Signature Date/Time: 11/17/2024/3:44:26 PM    Final    MONITORS  LONG TERM MONITOR (3-14 DAYS) 11/04/2024  Narrative Details: Patch Wear Time:  6 days and 10 hours. Patient had a min HR of 56 bpm, max HR of 159 bpm, and avg HR of 94 bpm.  Predominant Rhythm: Normal Sinus  Arrhythmias: No arrhythmias identified  Ectopic Beats: There were rare premature atrial contractions (<1%) and rare premature ventricular contractions (<1%)  Patient Triggered Events: There were 9 patient triggered events. These events were associated with normal sinus and/or sinus tachycardia.   Impressions: Unremarkable heart monitor results with predominant normal sinus rhythm. Ectopic beats were rare and within normal limits. No arrhythmias were seen.  Lonnie T. Floretta HEATH, MD [place]  [facility] 11/26/2024 9:21 AM   CT SCANS  CT CORONARY MORPH W/CTA COR W/SCORE 11/03/2024  Addendum 11/14/2024  8:07 PM ADDENDUM REPORT: 11/14/2024 20:05  EXAM: OVER-READ INTERPRETATION  CT CHEST  The following report is an over-read performed by radiologist Dr. Andrea Gasman of [facility] on 11/14/2024. This over-read does not include interpretation of cardiac or coronary anatomy or pathology. The coronary CTA interpretation by the cardiologist is attached.  COMPARISON:  None.  FINDINGS: Vascular: No aortic atherosclerosis. The included aorta is normal in caliber.  Mediastinum/nodes: No adenopathy or mass. Unremarkable  esophagus.  Lungs: No focal airspace disease. 5 mm right middle lobe nodule, series 304, image 79. No pleural fluid. The included airways are patent.  Upper abdomen: No acute or unexpected findings. Small cyst in the hepatic dome, needs no further imaging follow-up.  Musculoskeletal: There are no acute or suspicious osseous abnormalities.  IMPRESSION: 1. No  acute extracardiac findings. 2. A 5 mm right middle lobe nodule. If patient is low risk for malignancy, no routine follow-up imaging is recommended. If patient is high risk for malignancy, a non-contrast chest CT at 12 months is optional.This recommendation follows the consensus statement: Guidelines for Management of Incidental Pulmonary Nodules Detected on CT Images: From the Fleischner Society 2017; Radiology 2017; [phone].   Electronically Signed By: Andrea Gasman M.D. On: 11/14/2024 20:05  Narrative CLINICAL DATA:  59 Year-old Male  EXAM: Cardiac/Coronary CTA  TECHNIQUE: A non-contrast, gated CT scan was obtained with axial slices of 2.5 mm through the heart for calcium scoring. Calcium scoring was performed using the Agatston method. A 120 kV prospective, gated, contrast cardiac CT scan was obtained. Gantry rotation speed was 230 msec and collimation was 0.63 mm. Two sublingual nitroglycerin  tablets (0.8 mg) were given. The 3D data set was reconstructed with motion correction for the best systolic or diastolic phase. Images were analyzed on a dedicated workstation using MPR, MIP, and VRT modes. The patient received 100 cc of contrast.  FINDINGS: Coronary Arteries:  Normal coronary origin.  Right dominance.  Coronary Calcium Score: 0  Percentile: 1st for age, sex, and race matched control.  Left main: The left main is a large caliber vessel with a normal take off from the left coronary cusp that bifurcates to form a left anterior descending artery and a left circumflex artery. Very short left main. There is no significant plaque.  Left anterior descending artery: The LAD is a large caliber vessel with two small diagonal vessels. There is no significant plaque.  Left circumflex artery: The LCX is non-dominant with three obtuse marginal arteries. There is no significant plaque.  Right coronary artery: The RCA is dominant with normal take off from the right  coronary cusp. The RCA terminates as a PDA and right posterolateral branch. There is no significant plaque.  Other non-coronary findings:  Right Atrium: Right atrial size is dilated.  Right Ventricle: The right ventricular cavity is dilated.  Left Atrium: Left atrial size is normal in size with no left atrial appendage filling defect.  Left Ventricle: The ventricular cavity size is within normal limits.  Interatrial septum: Small PFO.  Main Pulmonary Artery: Moderate dilation, 32 mm.  Pulmonary veins: Normal pulmonary venous drainage.  Systemic veins: Normal venous drainage.  Pericardium: Normal thickness without significant effusion or calcium present.  Cardiac valves: The aortic valve is tri-leaflet without significant calcification. The mitral valve is normal without significant calcification.  Aorta: Normal caliber without significant calcifications.  Extra-cardiac findings: See attached radiology report for non-cardiac structures.  Artifact: NA  Image quality: Excellent  IMPRESSION: 1. Coronary calcium score of 0. This was 1st percentile for age, sex, and race matched control.  2. Normal coronary origin with right dominance.  3. CAD-RADS 0. No evidence of CAD (0%). Consider non-atherosclerotic causes of chest pain.  RECOMMENDATIONS: RECOMMENDATIONS The proposed cut-off value of 1,651 AU yielded a 93 % sensitivity and 75 % specificity in grading AS severity in patients with classical low-flow, low-gradient AS. Proposed different cut-off values to define severe AS for men and women as 2,065 AU  and 1,274 AU, respectively. The joint European and American recommendations for the assessment of AS consider the aortic valve calcium score as a continuum - a very high calcium score suggests severe AS and a low calcium score suggests severe AS is unlikely.  Donney VEAR Jarome LULLA Stephen RENETTE, et al. 2017 ESC/EACTS Guidelines for the management of valvular heart  disease. Eur Heart J 2017;38:2739-91.  Coronary artery calcium (CAC) score is a strong predictor of incident coronary heart disease (CHD) and provides predictive information beyond traditional risk factors. CAC scoring is reasonable to use in the decision to withhold, postpone, or initiate statin therapy in intermediate-risk or selected borderline-risk asymptomatic adults (age 50-75 years and LDL-C >=70 to <190 mg/dL) who do not have diabetes or established atherosclerotic cardiovascular disease (ASCVD).* In intermediate-risk (10-year ASCVD risk >=7.5% to <20%) adults or selected borderline-risk (10-year ASCVD risk >=5% to <7.5%) adults in whom a CAC score is measured for the purpose of making a treatment decision the following recommendations have been made:  If CAC = 0, it is reasonable to withhold statin therapy and reassess in 5 to 10 years, as long as higher risk conditions are absent (diabetes mellitus, family history of premature CHD in first degree relatives (males <55 years; females <65 years), cigarette smoking, LDL >=190 mg/dL or other independent risk factors).  If CAC is 1 to 99, it is reasonable to initiate statin therapy for patients >=22 years of age.  If CAC is >=100 or >=75th percentile, it is reasonable to initiate statin therapy at any age.  Cardiology referral should be considered for patients with CAC scores =400 or >=75th percentile.  *2018 AHA/ACC/AACVPR/AAPA/ABC/ACPM/ADA/AGS/APhA/ASPC/NLA/PCNA Guideline on the Management of Blood Cholesterol: A Report of the American College of Cardiology/American Heart Association Task Force on Clinical Practice Guidelines. J Am Coll Cardiol. 2019;73(24):3168-3209.  Stanly Leavens, MD  Electronically Signed: By: Stanly Leavens M.D. On: 11/04/2024 10:30     ______________________________________________________________________________________________                        Physical  Exam: VS: BP 134/78   Pulse 89   Ht 6' 3 (1.905 m)   Wt 263 lb (119.3 kg)   SpO2 94%   BMI 32.87 kg/m   GEN: Well nourished, in NAD HEENT: Normal NECK: No JVD CARDIAC: RRR, no murmurs, rubs, gallops RESPIRATORY: Clear to auscultation bilaterally ABDOMEN: Soft, non-tender, non-distended MUSCULOSKELETAL: No edema SKIN: Warm and dry NEUROLOGIC:  Alert and oriented x 3 PSYCHIATRIC:  Pleasant, normal affect   Assessment & Plan: 1. Precordial pain: He had work-up with coronary CTA that showed calcium score of 0 in 10/2024. Echo showed LVEF 60-65% without RWMAs. His pressure was non-exertional. He is relieved that his testing was normal. Pain could be musculoskeletal in nature due to neck injury and bilateral shoulder pain as well as chronic left arm numbness. GERD not completely ruled out.  2. Palpitations/PAF: Had brief atrial fibrillation episode dating back to 2008. Monitor 10/2024 did not show recurrence of atrial fibrillation. He denies further palpitations. He did have tachycardia that was associated with symptoms, but he feels that anxiety could have been a contributor with his pending test results.  - Encouraged to minimize caffeine, dehydration, and alcohol  Dispo: Follow-up as needed.  Signed, Saddie GORMAN Cleaves, NP 12/18/2024 11:44 AM [facility] "

## 2024-12-18 ENCOUNTER — Encounter: Payer: Self-pay | Admitting: General Practice

## 2024-12-18 ENCOUNTER — Ambulatory Visit: Attending: General Practice

## 2024-12-18 VITALS — BP 134/78 | HR 89 | Ht 75.0 in | Wt 263.0 lb

## 2024-12-18 DIAGNOSIS — R072 Precordial pain: Secondary | ICD-10-CM | POA: Diagnosis not present

## 2024-12-18 DIAGNOSIS — R002 Palpitations: Secondary | ICD-10-CM | POA: Diagnosis not present

## 2024-12-18 NOTE — Patient Instructions (Signed)
 Medication Instructions:  Your physician recommends that you continue on your current medications as directed. Please refer to the Current Medication list given to you today.  *If you need a refill on your cardiac medications before your next appointment, please call your pharmacy*  Lab Work: None  If you have labs (blood work) drawn today and your tests are completely normal, you will receive your results only by: MyChart Message (if you have MyChart) OR A paper copy in the mail If you have any lab test that is abnormal or we need to change your treatment, we will call you to review the results.  Testing/Procedures: None   Follow-Up: At [facility], you and your health needs are our priority.  As part of our continuing mission to provide you with exceptional heart care, our providers are all part of one team.  This team includes your primary Cardiologist (physician) and Advanced Practice Providers or APPs (Physician Assistants and Nurse Practitioners) who all work together to provide you with the care you need, when you need it.  Your next appointment:    As needed  Provider:   Josefa Beauvais, NP     Other Instructions None
